# Patient Record
Sex: Female | Born: 1995 | Race: Black or African American | Hispanic: No | Marital: Single | Smoking: Never smoker
Health system: Southern US, Community
[De-identification: ages and names within clinical notes are randomized; demographics above are authoritative.]

## PROBLEM LIST (undated history)

## (undated) ENCOUNTER — Inpatient Hospital Stay (HOSPITAL_COMMUNITY): Payer: Self-pay

## (undated) ENCOUNTER — Inpatient Hospital Stay: Payer: Self-pay

## (undated) DIAGNOSIS — G43909 Migraine, unspecified, not intractable, without status migrainosus: Secondary | ICD-10-CM

## (undated) DIAGNOSIS — K219 Gastro-esophageal reflux disease without esophagitis: Secondary | ICD-10-CM

## (undated) DIAGNOSIS — O99820 Streptococcus B carrier state complicating pregnancy: Secondary | ICD-10-CM

## (undated) DIAGNOSIS — O42919 Preterm premature rupture of membranes, unspecified as to length of time between rupture and onset of labor, unspecified trimester: Secondary | ICD-10-CM

## (undated) DIAGNOSIS — Z789 Other specified health status: Secondary | ICD-10-CM

## (undated) HISTORY — DX: Other specified health status: Z78.9

## (undated) HISTORY — PX: WISDOM TOOTH EXTRACTION: SHX21

## (undated) HISTORY — DX: Migraine, unspecified, not intractable, without status migrainosus: G43.909

## (undated) HISTORY — PX: NO PAST SURGERIES: SHX2092

---
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
## 2017-08-07 ENCOUNTER — Emergency Department
Admission: EM | Admit: 2017-08-07 | Discharge: 2017-08-07 | Disposition: A | Discharge status: Home / Self Care | Payer: Medicaid Other | Attending: Student in an Organized Health Care Education/Training Program | Admitting: Student in an Organized Health Care Education/Training Program

## 2017-08-07 ENCOUNTER — Encounter: Payer: Self-pay | Admitting: Emergency Medicine

## 2017-08-07 ENCOUNTER — Other Ambulatory Visit: Payer: Self-pay

## 2017-08-07 DIAGNOSIS — Z79899 Other long term (current) drug therapy: Secondary | ICD-10-CM | POA: Insufficient documentation

## 2017-08-07 DIAGNOSIS — Z7722 Contact with and (suspected) exposure to environmental tobacco smoke (acute) (chronic): Secondary | ICD-10-CM | POA: Diagnosis not present

## 2017-08-07 DIAGNOSIS — H5712 Ocular pain, left eye: Secondary | ICD-10-CM | POA: Diagnosis present

## 2017-08-07 DIAGNOSIS — Z202 Contact with and (suspected) exposure to infections with a predominantly sexual mode of transmission: Secondary | ICD-10-CM | POA: Diagnosis not present

## 2017-08-07 LAB — CHLAMYDIA/NGC RT PCR (ARMC ONLY)
Chlamydia Tr: NOT DETECTED
N gonorrhoeae: NOT DETECTED

## 2017-08-07 LAB — WET PREP, GENITAL
Clue Cells Wet Prep HPF POC: NONE SEEN
Sperm: NONE SEEN
Trich, Wet Prep: NONE SEEN
Yeast Wet Prep HPF POC: NONE SEEN

## 2017-08-07 MED ORDER — POLYMYXIN B-TRIMETHOPRIM 10000-0.1 UNIT/ML-% OP SOLN: 1.0000 [drp] | OPHTHALMIC | 0 refills | Status: AC

## 2017-08-07 MED ORDER — CEFTRIAXONE SODIUM 250 MG IJ SOLR
250.0000 mg | Freq: Once | INTRAMUSCULAR | Status: AC
  Administered 2017-08-07: 250 mg via INTRAMUSCULAR
  Filled 2017-08-07: qty 250

## 2017-08-07 MED ORDER — METRONIDAZOLE 500 MG PO TABS
2000.0000 mg | ORAL_TABLET | Freq: Once | ORAL | Status: AC
  Administered 2017-08-07: 2000 mg via ORAL
  Filled 2017-08-07: qty 4

## 2017-08-07 MED ORDER — AZITHROMYCIN 500 MG PO TABS
1000.0000 mg | ORAL_TABLET | Freq: Once | ORAL | Status: AC
  Administered 2017-08-07: 1000 mg via ORAL
  Filled 2017-08-07: qty 2

## 2017-08-07 NOTE — ED Notes (Signed)
"  My eyes have been bothering me - [facility] - now I think I gave it to my baby."

## 2017-08-07 NOTE — ED Triage Notes (Signed)
Left eye irritation x 3 days, cough x 2 days.  Also wants to have an STD check.

## 2017-08-07 NOTE — ED Provider Notes (Signed)
[facility] Emergency Department Provider Note  ____________________________________________  Time seen: Approximately 3:06 PM  I have reviewed the triage vital signs and the nursing notes.   HISTORY  Chief Complaint No chief complaint on file.    HPI Renee Barnett is a 22 y.o. female presents to the emergency department with left eye conjunctivitis, nonproductive cough and concerns for STDs.  Patient reports that she has had left eye irritation and nonproductive cough for the past 2 days.  Patient is concerned that she might have a yeast infection.  She has had increased, white vaginal discharge and vaginal pruritus.  Patient denies dysuria, increased urinary frequency, bilateral flank pain or dyspareunia.  Patient is accompanied by both her boyfriend and her daughter who is also being seen for eye irritation.  Patient is currently having her menses.   Past Medical History:  Diagnosis Date  . GERD (gastroesophageal reflux disease)   . Medical history non-contributory     Patient Active Problem List   Diagnosis Date Noted  . Preterm premature rupture of membranes (PPROM) with unknown onset of labor 10/29/2016  . GBS (group B Streptococcus carrier), +RV culture, currently pregnant 10/27/2016    Past Surgical History:  Procedure Laterality Date  . NO PAST SURGERIES      Prior to Admission medications   Medication Sig Start Date End Date Taking? Authorizing Provider  ibuprofen (ADVIL,MOTRIN) 600 MG tablet Take 1 tablet (600 mg total) by mouth every 6 (six) hours. Patient not taking: Reported on 11/29/2016 11/01/16   Montez Morita, CNM  metroNIDAZOLE (FLAGYL) 500 MG tablet Take 1 tablet (500 mg total) by mouth 2 (two) times daily. 12/01/16   Roe Coombs, CNM  Prenatal Vit-Fe Fumarate-FA (PRENATAL MULTIVITAMIN) TABS tablet Take 1 tablet by mouth daily at 12 noon.    [provider]  ranitidine (ZANTAC) 150 MG tablet Take 1 tablet (150 mg  total) by mouth 2 (two) times daily. Patient not taking: Reported on 11/29/2016 09/03/16   Brock Bad, MD  trimethoprim-polymyxin b Joaquim Lai) ophthalmic solution Place 1 drop into the left eye every 4 (four) hours for 7 days. 08/07/17 08/14/17  Orvil Feil, PA-C    Allergies Patient has no known allergies.  Family History  Problem Relation Age of Onset  . Hypertension Mother   . Graves' disease Maternal Aunt   . Diabetes Maternal Grandmother     Social History Social History   Tobacco Use  . Smoking status: Passive Smoke Exposure - Never Smoker  . Smokeless tobacco: Never Used  Substance Use Topics  . Alcohol use: No    Alcohol/week: 0.0 oz  . Drug use: No     Review of Systems  Constitutional: No fever/chills Eyes: Patient has left eye irritation.  ENT: No upper respiratory complaints. Cardiovascular: no chest pain. Respiratory: no cough. No SOB. Gastrointestinal: No abdominal pain.  No nausea, no vomiting.  No diarrhea.  No constipation. Genitourinary: Patient has changes in vaginal discharge. Musculoskeletal: Negative for musculoskeletal pain. Skin: Negative for rash, abrasions, lacerations, ecchymosis. Neurological: Negative for headaches, focal weakness or numbness.   ____________________________________________   PHYSICAL EXAM:  VITAL SIGNS: ED Triage Vitals  Enc Vitals Group     BP 08/07/17 1406 (!) 118/41     Pulse Rate 08/07/17 1406 77     Resp 08/07/17 1406 16     Temp 08/07/17 1406 98.9 F (37.2 C)     Temp Source 08/07/17 1406 Oral     SpO2  08/07/17 1406 100 %     Weight 08/07/17 1407 160 lb (72.6 kg)     Height 08/07/17 1407 5\' 4"  (1.626 m)     Head Circumference --      Peak Flow --      Pain Score 08/07/17 1409 0     Pain Loc --      Pain Edu? --      Excl. in GC? --      Constitutional: Patient is sitting comfortably. Eyes: Left bulbar conjunctivitis visualized.  No matting or crusting visualized.  PERRL. EOMI. Head:  Atraumatic. ENT:      Ears: TMs are injected bilaterally.      Nose: No congestion/rhinnorhea.      Mouth/Throat: Mucous membranes are moist. Hematological/Lymphatic/Immunilogical: No cervical lymphadenopathy. Cardiovascular: Normal rate, regular rhythm. Normal S1 and S2.  Good peripheral circulation. Respiratory: Normal respiratory effort without tachypnea or retractions. Lungs CTAB. Good air entry to the bases with no decreased or absent breath sounds. Gastrointestinal: Bowel sounds 4 quadrants. Soft and nontender to palpation. No guarding or rigidity. No palpable masses. No distention. No CVA tenderness. Genitourinary: No cervical motion tenderness was elicited.  Copious blood visualized in the vaginal vault. Musculoskeletal: Full range of motion to all extremities. No gross deformities appreciated. Neurologic:  Normal speech and language. No gross focal neurologic deficits are appreciated.  Skin:  Skin is warm, dry and intact. No rash noted. Psychiatric: Mood and affect are normal. Speech and behavior are normal. Patient exhibits appropriate insight and judgement.   ____________________________________________   LABS (all labs ordered are listed, but only abnormal results are displayed)  Labs Reviewed  CHLAMYDIA/NGC RT PCR ([facility] ONLY)  WET PREP, GENITAL   ____________________________________________  EKG   ____________________________________________  RADIOLOGY   No results found.  ____________________________________________    PROCEDURES  Procedure(s) performed:    Procedures    Medications  cefTRIAXone (ROCEPHIN) injection 250 mg (250 mg Intramuscular Given 08/07/17 1550)  metroNIDAZOLE (FLAGYL) tablet 2,000 mg (2,000 mg Oral Given 08/07/17 1552)  azithromycin (ZITHROMAX) tablet 1,000 mg (1,000 mg Oral Given 08/07/17 1551)     ____________________________________________   INITIAL IMPRESSION / ASSESSMENT AND PLAN / ED COURSE  Pertinent labs &  imaging results that were available during my care of the patient were reviewed by me and considered in my medical decision making (see chart for details).  Review of the [place] CSRS was performed in accordance of the [facility] prior to dispensing any controlled drugs.    Assessment and Plan:  STD exposure Conjunctivitis Patient presents to the emergency department after STD exposure.  Patient was tested for gonorrhea, chlamydia and trichomoniasis in the emergency department.  Patient opted to be treated empirically for gonorrhea, chlamydia, trichomoniasis and yeast vaginitis.  Patient received Rocephin, azithromycin and Flagyl in the emergency department.  Patient was advised to seek care at local health department for full STD panel.  Patient voiced understanding.  Vital signs remained reassuring throughout emergency department course.  All patient questions were answered.   ____________________________________________  FINAL CLINICAL IMPRESSION(S) / ED DIAGNOSES  Final diagnoses:  STD exposure      NEW MEDICATIONS STARTED DURING THIS VISIT:  ED Discharge Orders        Ordered    trimethoprim-polymyxin b (POLYTRIM) ophthalmic solution  Every 4 hours     08/07/17 1538          This chart was dictated using voice recognition software/Dragon. Despite best efforts to proofread, errors can occur which  can change the meaning. Any change was purely unintentional.    Orvil FeilWoods, Margi Edmundson M, PA-C 08/07/17 1555    Willy Eddyobinson, Patrick, MD 08/07/17 [phone]

## 2017-12-30 DIAGNOSIS — B009 Herpesviral infection, unspecified: Secondary | ICD-10-CM

## 2017-12-30 HISTORY — DX: Herpesviral infection, unspecified: B00.9

## 2018-02-23 ENCOUNTER — Encounter: Payer: Self-pay | Admitting: Emergency Medicine

## 2018-02-23 ENCOUNTER — Emergency Department
Admission: EM | Admit: 2018-02-23 | Discharge: 2018-02-23 | Disposition: A | Discharge status: Home / Self Care | Payer: Medicaid Other | Attending: Emergency Medicine | Admitting: Emergency Medicine

## 2018-02-23 ENCOUNTER — Other Ambulatory Visit: Payer: Self-pay

## 2018-02-23 DIAGNOSIS — Z7722 Contact with and (suspected) exposure to environmental tobacco smoke (acute) (chronic): Secondary | ICD-10-CM | POA: Insufficient documentation

## 2018-02-23 DIAGNOSIS — M436 Torticollis: Secondary | ICD-10-CM | POA: Diagnosis not present

## 2018-02-23 DIAGNOSIS — Z79899 Other long term (current) drug therapy: Secondary | ICD-10-CM | POA: Insufficient documentation

## 2018-02-23 DIAGNOSIS — M542 Cervicalgia: Secondary | ICD-10-CM | POA: Diagnosis present

## 2018-02-23 DIAGNOSIS — M62838 Other muscle spasm: Secondary | ICD-10-CM | POA: Diagnosis not present

## 2018-02-23 MED ORDER — IBUPROFEN 600 MG PO TABS
600.0000 mg | ORAL_TABLET | Freq: Once | ORAL | Status: AC
  Administered 2018-02-23: 600 mg via ORAL
  Filled 2018-02-23: qty 1

## 2018-02-23 MED ORDER — IBUPROFEN 800 MG PO TABS: 800.0000 mg | ORAL_TABLET | Freq: Three times a day (TID) | ORAL | 0 refills | Status: DC | PRN

## 2018-02-23 MED ORDER — CYCLOBENZAPRINE HCL 10 MG PO TABS
10.0000 mg | ORAL_TABLET | Freq: Once | ORAL | Status: AC
  Administered 2018-02-23: 10 mg via ORAL
  Filled 2018-02-23: qty 1

## 2018-02-23 MED ORDER — CYCLOBENZAPRINE HCL 5 MG PO TABS: 5.0000 mg | ORAL_TABLET | Freq: Three times a day (TID) | ORAL | 0 refills | Status: DC | PRN

## 2018-02-23 NOTE — Discharge Instructions (Signed)
Your exam is consistent with a muscle strain and spasms. Take the prescription meds as directed. Apply ice or moist heat to the muscles. Considering using a firm rubber ball to perform muscle manipulation and massage. Follow-up with your provider for ongoing symptoms.

## 2018-02-23 NOTE — ED Provider Notes (Signed)
[facility] Emergency Department Provider Note ____________________________________________  Time seen: 1127  I have reviewed the triage vital signs and the nursing notes.  HISTORY  Chief Complaint  Neck Pain  HPI Renee Barnett is a 22 y.o. female presents to the ED with complaints of left neck and upper back pain.  Patient describes onset was this morning upon awakening.  She denies any preceding injury, accident, or trauma.  Also denies any headache, chest pain, shortness of breath.  She is also denying any distal paresthesias, grip changes, or arm weakness.  Patient has taken a 600 mg dose of ibuprofen without any benefit to her symptoms.  She localizes pain to the left side of the neck and into the upper trapezius region.  Relief is achieved by holding her head in a right lateral position.  Past Medical History:  Diagnosis Date  . GERD (gastroesophageal reflux disease)   . Medical history non-contributory     Patient Active Problem List   Diagnosis Date Noted  . Preterm premature rupture of membranes (PPROM) with unknown onset of labor 10/29/2016  . GBS (group B Streptococcus carrier), +RV culture, currently pregnant 10/27/2016    Past Surgical History:  Procedure Laterality Date  . NO PAST SURGERIES      Prior to Admission medications   Medication Sig Start Date End Date Taking? Authorizing Provider  cyclobenzaprine (FLEXERIL) 5 MG tablet Take 1 tablet (5 mg total) by mouth 3 (three) times daily as needed for muscle spasms. 02/23/18   Kailey Esquilin, Charlesetta Ivory, PA-C  ibuprofen (ADVIL,MOTRIN) 800 MG tablet Take 1 tablet (800 mg total) by mouth every 8 (eight) hours as needed. 02/23/18   Alajia Schmelzer, Charlesetta Ivory, PA-C  Prenatal Vit-Fe Fumarate-FA (PRENATAL MULTIVITAMIN) TABS tablet Take 1 tablet by mouth daily at 12 noon.    [provider]    Allergies Patient has no known allergies.  Family History  Problem Relation Age of Onset  .  Hypertension Mother   . Graves' disease Maternal Aunt   . Diabetes Maternal Grandmother     Social History Social History   Tobacco Use  . Smoking status: Passive Smoke Exposure - Never Smoker  . Smokeless tobacco: Never Used  Substance Use Topics  . Alcohol use: No    Alcohol/week: 0.0 oz  . Drug use: No    Review of Systems  Constitutional: Negative for fever. Eyes: Negative for visual changes. ENT: Negative for sore throat. Cardiovascular: Negative for chest pain. Respiratory: Negative for shortness of breath. Gastrointestinal: Negative for abdominal pain, vomiting and diarrhea. Genitourinary: Negative for dysuria. Musculoskeletal: Negative for back pain.  Reports left neck and upper back pain as above. Skin: Negative for rash. Neurological: Negative for headaches, focal weakness or numbness. ____________________________________________  PHYSICAL EXAM:  VITAL SIGNS: ED Triage Vitals  Enc Vitals Group     BP 02/23/18 1122 131/60     Pulse Rate 02/23/18 1122 67     Resp 02/23/18 1122 16     Temp 02/23/18 1122 98.2 F (36.8 C)     Temp Source 02/23/18 1122 Oral     SpO2 02/23/18 1122 100 %     Weight 02/23/18 1117 180 lb (81.6 kg)     Height 02/23/18 1117 5\' 4"  (1.626 m)     Head Circumference --      Peak Flow --      Pain Score 02/23/18 1116 6     Pain Loc --      Pain  Edu? --      Excl. in GC? --     Constitutional: Alert and oriented. Well appearing and in no distress. Head: Normocephalic and atraumatic. Eyes: Conjunctivae are normal. Normal extraocular movements Neck: Supple.  No midline tenderness or palpable deformity noted.  Patient without any spasm to palpation of the left SCM.  She does have some palpable spasm with palpation over the left lower trapezius muscle. Cardiovascular: Normal rate, regular rhythm. Normal distal pulses. Respiratory: Normal respiratory effort. No wheezes/rales/rhonchi. Musculoskeletal: Normal spinal alignment without  midline tenderness, spasm, deformity, or step-off.  Nontender with normal range of motion in the upper extremities.  Normal composite fist and grip strength bilaterally. Neurologic: Cranial nerves II through XII grossly intact.  Normal intrinsic and opposition testing.. Normal speech and language. No gross focal neurologic deficits are appreciated. Skin:  Skin is warm, dry and intact. No rash noted. Psychiatric: Mood and affect are normal. Patient exhibits appropriate insight and judgment. ____________________________________________  PROCEDURES  Procedures Flexeril 10 mg PO IBU 800 mg PO ____________________________________________  INITIAL IMPRESSION / ASSESSMENT AND PLAN / ED COURSE  Patient with ED evaluation of left neck pain and spasm with sudden onset upon awakening this morning.  Her exam is consistent with muscle spasm and acute torticollis.  Patient's exam is otherwise benign without any signs of acute neuromuscular deficit.  She will be discharged with prescriptions for Flexeril and ibuprofen to dose as directed.  A work note is provided for 2 days as requested.  Return precautions have been reviewed. ____________________________________________  FINAL CLINICAL IMPRESSION(S) / ED DIAGNOSES  Final diagnoses:  Trapezius muscle spasm  Torticollis, acute      Zhaire Locker, Charlesetta IvoryJenise V Bacon, PA-C 02/23/18 1204    Minna AntisPaduchowski, Kevin, MD 02/23/18 1416

## 2018-02-23 NOTE — ED Triage Notes (Signed)
C/O left neck and upper back pain.  Denies injury.  States woke up this morning with pain.  Has taken Ibuprofen, with no relief.

## 2018-06-24 ENCOUNTER — Encounter: Payer: Self-pay | Admitting: Emergency Medicine

## 2018-06-24 ENCOUNTER — Emergency Department
Admission: EM | Admit: 2018-06-24 | Discharge: 2018-06-24 | Disposition: A | Discharge status: Home / Self Care | Payer: Medicaid Other | Attending: Emergency Medicine | Admitting: Emergency Medicine

## 2018-06-24 ENCOUNTER — Other Ambulatory Visit: Payer: Self-pay

## 2018-06-24 DIAGNOSIS — J0100 Acute maxillary sinusitis, unspecified: Secondary | ICD-10-CM | POA: Insufficient documentation

## 2018-06-24 DIAGNOSIS — Z79899 Other long term (current) drug therapy: Secondary | ICD-10-CM | POA: Insufficient documentation

## 2018-06-24 DIAGNOSIS — Z7722 Contact with and (suspected) exposure to environmental tobacco smoke (acute) (chronic): Secondary | ICD-10-CM | POA: Diagnosis not present

## 2018-06-24 DIAGNOSIS — J3489 Other specified disorders of nose and nasal sinuses: Secondary | ICD-10-CM | POA: Diagnosis present

## 2018-06-24 DIAGNOSIS — R590 Localized enlarged lymph nodes: Secondary | ICD-10-CM | POA: Diagnosis not present

## 2018-06-24 MED ORDER — AMOXICILLIN-POT CLAVULANATE 875-125 MG PO TABS: 1.0000 | ORAL_TABLET | Freq: Two times a day (BID) | ORAL | 0 refills | Status: AC

## 2018-06-24 NOTE — ED Triage Notes (Signed)
Sinus congestion x 1 week, green sinus drainage.

## 2018-06-24 NOTE — Discharge Instructions (Addendum)
Return to ER for increasing pain, fevers, urgent changes in your health. Take antibiotic as prescribed.

## 2018-06-24 NOTE — ED Notes (Signed)
Pt  Reports  Facial  Pain around l  Eye  With   Congestion  cough as well   Symptoms x 1  Week

## 2018-06-24 NOTE — ED Provider Notes (Signed)
[facility] EMERGENCY DEPARTMENT Provider Note   CSN: [id number] Arrival date & time: 06/24/18  1651     History   Chief Complaint Chief Complaint  Patient presents with  . Facial Pain    HPI Renee Barnett is a 22 y.o. female presents to the emergency department for evaluation of 1 week of left-sided maxillary sinus pain pressure with congestion.  Patient's symptoms increasing over the last couple days.  She denies any fevers but pain is moderate.  No vision changes or pain with extraocular eye movement.  No redness or drainage.  No photophobia.  No shortness of breath, chest pain.  She is not taking any Tylenol or ibuprofen for pain.  She has taken 1 dose of over-the-counter sinus medication with little relief.  HPI  Past Medical History:  Diagnosis Date  . GERD (gastroesophageal reflux disease)   . Medical history non-contributory     Patient Active Problem List   Diagnosis Date Noted  . Preterm premature rupture of membranes (PPROM) with unknown onset of labor 10/29/2016  . GBS (group B Streptococcus carrier), +RV culture, currently pregnant 10/27/2016    Past Surgical History:  Procedure Laterality Date  . NO PAST SURGERIES       OB History    Gravida  2   Para  2   Term  2   Preterm  0   AB  0   Living  2     SAB  0   TAB  0   Ectopic  0   Multiple  0   Live Births  2            Home Medications    Prior to Admission medications   Medication Sig Start Date End Date Taking? Authorizing Provider  amoxicillin-clavulanate (AUGMENTIN) 875-125 MG tablet Take 1 tablet by mouth every 12 (twelve) hours for 10 days. 06/24/18 07/04/18  Evon SlackGaines, Aries Kasa C, PA-C  cyclobenzaprine (FLEXERIL) 5 MG tablet Take 1 tablet (5 mg total) by mouth 3 (three) times daily as needed for muscle spasms. 02/23/18   Menshew, Charlesetta IvoryJenise V Bacon, PA-C  ibuprofen (ADVIL,MOTRIN) 800 MG tablet Take 1 tablet (800 mg total) by mouth every 8 (eight) hours as  needed. 02/23/18   Menshew, Charlesetta IvoryJenise V Bacon, PA-C  Prenatal Vit-Fe Fumarate-FA (PRENATAL MULTIVITAMIN) TABS tablet Take 1 tablet by mouth daily at 12 noon.    [provider]    Family History Family History  Problem Relation Age of Onset  . Hypertension Mother   . Graves' disease Maternal Aunt   . Diabetes Maternal Grandmother     Social History Social History   Tobacco Use  . Smoking status: Passive Smoke Exposure - Never Smoker  . Smokeless tobacco: Never Used  Substance Use Topics  . Alcohol use: No    Alcohol/week: 0.0 standard drinks  . Drug use: No     Allergies   Patient has no known allergies.   Review of Systems Review of Systems  Constitutional: Negative for fever.  HENT: Positive for congestion, rhinorrhea, sinus pressure and sinus pain. Negative for ear discharge, sore throat, trouble swallowing and voice change.   Eyes: Negative for photophobia, discharge and redness.  Respiratory: Positive for cough. Negative for shortness of breath, wheezing and stridor.   Cardiovascular: Negative for chest pain.  Gastrointestinal: Negative for abdominal pain, diarrhea, nausea and vomiting.  Genitourinary: Negative for dysuria, flank pain and pelvic pain.  Musculoskeletal: Positive for myalgias. Negative for back pain.  Skin: Negative for rash.  Neurological: Negative for dizziness and headaches.     Physical Exam Updated Vital Signs BP 129/78   Pulse 81   Temp 98.1 F (36.7 C) (Oral)   Resp 18   Ht 5\' 4"  (1.626 m)   Wt 79.4 kg   LMP 06/10/2018   SpO2 100%   BMI 30.04 kg/m   Physical Exam  Constitutional: She is oriented to person, place, and time. She appears well-developed and well-nourished. No distress.  HENT:  Head: Normocephalic and atraumatic.  Right Ear: Hearing, tympanic membrane, external ear and ear canal normal.  Left Ear: Hearing, tympanic membrane, external ear and ear canal normal.  Nose: Rhinorrhea present.  Mouth/Throat: Mucous  membranes are normal. No trismus in the jaw. No uvula swelling. Posterior oropharyngeal erythema present. No oropharyngeal exudate, posterior oropharyngeal edema or tonsillar abscesses. No tonsillar exudate.  Positive left greater than right maxillary sinus tenderness to palpation.  There is no erythema or swelling noted.  Full extraocular eye movement with no discomfort.  No conjunctival erythema bilaterally.  Eyes: Pupils are equal, round, and reactive to light. Conjunctivae are normal. Right eye exhibits no discharge. Left eye exhibits no discharge.  Neck: Normal range of motion.  Cardiovascular: Normal rate and regular rhythm.  Pulmonary/Chest: Effort normal and breath sounds normal. No stridor. No respiratory distress. She has no wheezes. She has no rales.  Abdominal: Soft. She exhibits no distension. There is no tenderness.  Musculoskeletal: Normal range of motion. She exhibits no deformity.  Lymphadenopathy:    She has cervical adenopathy.  Neurological: She is alert and oriented to person, place, and time. She has normal reflexes.  Skin: Skin is warm and dry.  Psychiatric: She has a normal mood and affect. Her behavior is normal. Thought content normal.  Nursing note and vitals reviewed.    ED Treatments / Results  Labs (all labs ordered are listed, but only abnormal results are displayed) Labs Reviewed - No data to display  EKG None  Radiology No results found.  Procedures Procedures (including critical care time)  Medications Ordered in ED Medications - No data to display   Initial Impression / Assessment and Plan / ED Course  I have reviewed the triage vital signs and the nursing notes.  Pertinent labs & imaging results that were available during my care of the patient were reviewed by me and considered in my medical decision making (see chart for details).     22 year old female with known recurrent maxillary sinusitis.  She started on Augmentin.  She will  continue with over-the-counter decongestant medication.  She is encouraged to take ibuprofen as needed for pain.  We discussed Veneta Penton.  She understands signs symptoms return to the ED for.  Final Clinical Impressions(s) / ED Diagnoses   Final diagnoses:  Acute non-recurrent maxillary sinusitis    ED Discharge Orders         Ordered    amoxicillin-clavulanate (AUGMENTIN) 875-125 MG tablet  Every 12 hours     06/24/18 1738           Ronnette Juniper 06/24/18 1749    Jene Every, MD 06/24/18 1911

## 2018-10-10 LAB — HM HIV SCREENING LAB: HM HIV Screening: NEGATIVE

## 2019-01-20 ENCOUNTER — Other Ambulatory Visit: Payer: Self-pay

## 2019-01-20 ENCOUNTER — Emergency Department
Admission: EM | Admit: 2019-01-20 | Discharge: 2019-01-20 | Disposition: A | Discharge status: Home / Self Care | Payer: Medicaid Other | Attending: Emergency Medicine | Admitting: Emergency Medicine

## 2019-01-20 DIAGNOSIS — J029 Acute pharyngitis, unspecified: Secondary | ICD-10-CM | POA: Insufficient documentation

## 2019-01-20 DIAGNOSIS — Z7722 Contact with and (suspected) exposure to environmental tobacco smoke (acute) (chronic): Secondary | ICD-10-CM | POA: Insufficient documentation

## 2019-01-20 DIAGNOSIS — Z79899 Other long term (current) drug therapy: Secondary | ICD-10-CM | POA: Insufficient documentation

## 2019-01-20 LAB — GROUP A STREP BY PCR: Group A Strep by PCR: NOT DETECTED

## 2019-01-20 MED ORDER — LIDOCAINE VISCOUS HCL 2 % MT SOLN
15.0000 mL | Freq: Once | OROMUCOSAL | Status: AC
  Administered 2019-01-20: 15 mL via OROMUCOSAL
  Filled 2019-01-20: qty 15

## 2019-01-20 MED ORDER — FEXOFENADINE-PSEUDOEPHED ER 60-120 MG PO TB12: 1.0000 | ORAL_TABLET | Freq: Two times a day (BID) | ORAL | 0 refills | Status: DC

## 2019-01-20 MED ORDER — MAGIC MOUTHWASH W/LIDOCAINE: 5.0000 mL | Freq: Four times a day (QID) | ORAL | 0 refills | Status: DC

## 2019-01-20 MED ORDER — DIPHENHYDRAMINE HCL 12.5 MG/5ML PO ELIX
12.5000 mg | ORAL_SOLUTION | Freq: Once | ORAL | Status: AC
  Administered 2019-01-20: 12.5 mg via ORAL
  Filled 2019-01-20: qty 5

## 2019-01-20 MED ORDER — LIDOCAINE VISCOUS HCL 2 % MT SOLN: 5.0000 mL | Freq: Four times a day (QID) | OROMUCOSAL | 0 refills | Status: DC | PRN

## 2019-01-20 NOTE — ED Provider Notes (Signed)
[facility] Emergency Department Provider Note   ____________________________________________   First MD Initiated Contact with Patient 01/20/19 [phone]     (approximate)  I have reviewed the triage vital signs and the nursing notes.   HISTORY  Chief Complaint Sore Throat    HPI Renee Barnett is a 23 y.o. female patient presents with sore throat for 2 days.  Patient state can tolerate fluids with discomfort with solid foods.  Patient also complained of nasal congestion and postnasal drainage.  Patient denies nausea, vomiting, diarrhea.  Patient rates the pain discomfort a 5/10.  Patient described pain is "sore".  No palliative measure for complaint.         Past Medical History:  Diagnosis Date   GERD (gastroesophageal reflux disease)    Medical history non-contributory     Patient Active Problem List   Diagnosis Date Noted   Preterm premature rupture of membranes (PPROM) with unknown onset of labor 10/29/2016   GBS (group B Streptococcus carrier), +RV culture, currently pregnant 10/27/2016    Past Surgical History:  Procedure Laterality Date   NO PAST SURGERIES      Prior to Admission medications   Medication Sig Start Date End Date Taking? Authorizing Provider  cyclobenzaprine (FLEXERIL) 5 MG tablet Take 1 tablet (5 mg total) by mouth 3 (three) times daily as needed for muscle spasms. 02/23/18   Menshew, Charlesetta IvoryJenise V Bacon, PA-C  fexofenadine-pseudoephedrine (ALLEGRA-D) 60-120 MG 12 hr tablet Take 1 tablet by mouth 2 (two) times daily. 01/20/19   Joni ReiningSmith, Hetal Proano K, PA-C  ibuprofen (ADVIL,MOTRIN) 800 MG tablet Take 1 tablet (800 mg total) by mouth every 8 (eight) hours as needed. 02/23/18   Menshew, Charlesetta IvoryJenise V Bacon, PA-C  lidocaine (XYLOCAINE) 2 % solution Use as directed 5 mLs in the mouth or throat every 6 (six) hours as needed for mouth pain. 01/20/19   Joni ReiningSmith, Athaliah Baumbach K, PA-C  Prenatal Vit-Fe Fumarate-FA (PRENATAL MULTIVITAMIN) TABS tablet Take 1  tablet by mouth daily at 12 noon.    [provider]    Allergies Patient has no known allergies.  Family History  Problem Relation Age of Onset   Hypertension Mother    Luiz BlareGraves' disease Maternal Aunt    Diabetes Maternal Grandmother     Social History Social History   Tobacco Use   Smoking status: Passive Smoke Exposure - Never Smoker   Smokeless tobacco: Never Used  Substance Use Topics   Alcohol use: No    Alcohol/week: 0.0 standard drinks   Drug use: No    Review of Systems Constitutional: No fever/chills Eyes: No visual changes. ENT: Sore throat and nasal congestion.   Cardiovascular: Denies chest pain. Respiratory: Denies shortness of breath. Gastrointestinal: No abdominal pain.  No nausea, no vomiting.  No diarrhea.  No constipation. Genitourinary: Negative for dysuria. Musculoskeletal: Negative for back pain. Skin: Negative for rash. Neurological: Negative for headaches, focal weakness or numbness. ____________________________________________   PHYSICAL EXAM:  VITAL SIGNS: ED Triage Vitals  Enc Vitals Group     BP 01/20/19 0629 118/67     Pulse Rate 01/20/19 0629 60     Resp 01/20/19 0629 18     Temp 01/20/19 0629 99.1 F (37.3 C)     Temp Source 01/20/19 0629 Oral     SpO2 01/20/19 0629 99 %     Weight 01/20/19 0626 166 lb (75.3 kg)     Height 01/20/19 0626 5\' 4"  (1.626 m)     Head Circumference --  Peak Flow --      Pain Score 01/20/19 0626 5     Pain Loc --      Pain Edu? --      Excl. in [place]? --     Constitutional: Alert and oriented. Well appearing and in no acute distress. Eyes: Conjunctivae are normal. PERRL. EOMI. Head: Atraumatic. Nose: Edematous nasal turbinates.   Mouth/Throat: Mucous membranes are moist.  Oropharynx erythematous without exudate. Neck: No stridor.  Hematological/Lymphatic/Immunilogical: No cervical lymphadenopathy. Cardiovascular: Normal rate, regular rhythm. Grossly normal heart sounds.  Good  peripheral circulation. Respiratory: Normal respiratory effort.  No retractions. Lungs CTAB. Neurologic:  Normal speech and language. No gross focal neurologic deficits are appreciated. No gait instability. Skin:  Skin is warm, dry and intact. No rash noted. Psychiatric: Mood and affect are normal. Speech and behavior are normal.  ____________________________________________   LABS (all labs ordered are listed, but only abnormal results are displayed)  Labs Reviewed  GROUP A STREP BY PCR   ____________________________________________  EKG   ____________________________________________  RADIOLOGY  ED MD interpretation:    Official radiology report(s): No results found.  ____________________________________________   PROCEDURES  Procedure(s) performed (including Critical Care):  Procedures   ____________________________________________   INITIAL IMPRESSION / ASSESSMENT AND PLAN / ED COURSE  As part of my medical decision making, I reviewed the following data within the [place]         Patient presents with 2 days of sore throat.  Discussed negative rapid strep results with patient.  Patient will be treated for viral pharyngitis.  Patient advised to follow-up PCP as needed.      ____________________________________________   FINAL CLINICAL IMPRESSION(S) / ED DIAGNOSES  Final diagnoses:  Viral pharyngitis     ED Discharge Orders         Ordered    fexofenadine-pseudoephedrine (ALLEGRA-D) 60-120 MG 12 hr tablet  2 times daily     01/20/19 0755    magic mouthwash w/lidocaine SOLN  4 times daily,   Status:  Discontinued     01/20/19 0755    lidocaine (XYLOCAINE) 2 % solution  Every 6 hours PRN     01/20/19 0757           Note:  This document was prepared using Dragon voice recognition software and may include unintentional dictation errors.    Sable Feil, PA-C 01/20/19 0800    Nena Polio, MD 01/20/19 [phone]

## 2019-01-20 NOTE — ED Triage Notes (Signed)
Patient reports sore throat for 2 days worse tonight.

## 2019-02-18 ENCOUNTER — Other Ambulatory Visit: Payer: Self-pay

## 2019-02-18 ENCOUNTER — Emergency Department (HOSPITAL_COMMUNITY)
Admission: EM | Admit: 2019-02-18 | Discharge: 2019-02-18 | Disposition: A | Discharge status: Home / Self Care | Payer: Medicaid Other | Attending: Emergency Medicine | Admitting: Emergency Medicine

## 2019-02-18 ENCOUNTER — Encounter (HOSPITAL_COMMUNITY): Payer: Self-pay

## 2019-02-18 DIAGNOSIS — S0502XA Injury of conjunctiva and corneal abrasion without foreign body, left eye, initial encounter: Secondary | ICD-10-CM | POA: Insufficient documentation

## 2019-02-18 DIAGNOSIS — Y9389 Activity, other specified: Secondary | ICD-10-CM | POA: Insufficient documentation

## 2019-02-18 DIAGNOSIS — Y929 Unspecified place or not applicable: Secondary | ICD-10-CM | POA: Insufficient documentation

## 2019-02-18 DIAGNOSIS — Y999 Unspecified external cause status: Secondary | ICD-10-CM | POA: Insufficient documentation

## 2019-02-18 DIAGNOSIS — H1033 Unspecified acute conjunctivitis, bilateral: Secondary | ICD-10-CM

## 2019-02-18 DIAGNOSIS — S0501XA Injury of conjunctiva and corneal abrasion without foreign body, right eye, initial encounter: Secondary | ICD-10-CM | POA: Insufficient documentation

## 2019-02-18 DIAGNOSIS — Z79899 Other long term (current) drug therapy: Secondary | ICD-10-CM | POA: Insufficient documentation

## 2019-02-18 DIAGNOSIS — X58XXXA Exposure to other specified factors, initial encounter: Secondary | ICD-10-CM | POA: Insufficient documentation

## 2019-02-18 MED ORDER — FLUORESCEIN SODIUM 1 MG OP STRP
2.0000 | ORAL_STRIP | Freq: Once | OPHTHALMIC | Status: AC
  Administered 2019-02-18: 2 via OPHTHALMIC
  Filled 2019-02-18: qty 2

## 2019-02-18 MED ORDER — ERYTHROMYCIN 5 MG/GM OP OINT: TOPICAL_OINTMENT | OPHTHALMIC | 0 refills | Status: DC

## 2019-02-18 MED ORDER — TETRACAINE HCL 0.5 % OP SOLN
2.0000 [drp] | Freq: Once | OPHTHALMIC | Status: AC
  Administered 2019-02-18: 2 [drp] via OPHTHALMIC
  Filled 2019-02-18: qty 4

## 2019-02-18 NOTE — Discharge Instructions (Signed)
1. Medications: erythromycin, usual home medications 2. Treatment: rest, drink plenty of fluids, warm compresses 3. Follow Up: Please followup with Ophthalmology in 2 days if no improvement or worsening; Please return to the ER for vision changes, fevers or other concerns

## 2019-02-18 NOTE — ED Notes (Signed)
Woods Lamp at bedside.

## 2019-02-18 NOTE — ED Provider Notes (Signed)
[facility] EMERGENCY DEPARTMENT Provider Note   CSN: [id number] Arrival date & time: 02/18/19  2149    History   Chief Complaint Chief Complaint  Patient presents with  . Eye Drainage    HPI Renee Barnett is a 23 y.o. female with a hx of No major medical hx presents to the Emergency Department complaining of gradual, persistent, progressively worsening bilateral redness and irritation to her eyes.  Patient reports that yesterday she had her lash extensions placed and awoke this morning with drainage and matting to both eyes.  Patient reports this is not the first time she has had lash extensions but it is the first time she has awoken with drainage from her eyes.  She reports no pain or swelling at the site of the lash extensions.  No vision changes.  No fever, chills, headache, neck pain, lymphadenopathy.  Patient does not use contact lenses or glasses.  No aggravating or alleviating factors.  No treatments prior to arrival.     The history is provided by the patient and medical records. No language interpreter was used.    Past Medical History:  Diagnosis Date  . GERD (gastroesophageal reflux disease)   . Medical history non-contributory     Patient Active Problem List   Diagnosis Date Noted  . Preterm premature rupture of membranes (PPROM) with unknown onset of labor 10/29/2016  . GBS (group B Streptococcus carrier), +RV culture, currently pregnant 10/27/2016    Past Surgical History:  Procedure Laterality Date  . NO PAST SURGERIES       OB History    Gravida  2   Para  2   Term  2   Preterm  0   AB  0   Living  2     SAB  0   TAB  0   Ectopic  0   Multiple  0   Live Births  2            Home Medications    Prior to Admission medications   Medication Sig Start Date End Date Taking? Authorizing Provider  cyclobenzaprine (FLEXERIL) 5 MG tablet Take 1 tablet (5 mg total) by mouth 3 (three) times daily as needed for muscle  spasms. 02/23/18   Menshew, Charlesetta IvoryJenise V Bacon, PA-C  erythromycin ophthalmic ointment Place a 1/2 inch ribbon of ointment into the lower eyelid every 4 hours for 5 days 02/18/19   Baljit Liebert, Dahlia ClientHannah, PA-C  fexofenadine-pseudoephedrine (ALLEGRA-D) 60-120 MG 12 hr tablet Take 1 tablet by mouth 2 (two) times daily. 01/20/19   Joni ReiningSmith, Ronald K, PA-C  ibuprofen (ADVIL,MOTRIN) 800 MG tablet Take 1 tablet (800 mg total) by mouth every 8 (eight) hours as needed. 02/23/18   Menshew, Charlesetta IvoryJenise V Bacon, PA-C  lidocaine (XYLOCAINE) 2 % solution Use as directed 5 mLs in the mouth or throat every 6 (six) hours as needed for mouth pain. 01/20/19   Joni ReiningSmith, Ronald K, PA-C  Prenatal Vit-Fe Fumarate-FA (PRENATAL MULTIVITAMIN) TABS tablet Take 1 tablet by mouth daily at 12 noon.    [provider]    Family History Family History  Problem Relation Age of Onset  . Hypertension Mother   . Graves' disease Maternal Aunt   . Diabetes Maternal Grandmother     Social History Social History   Tobacco Use  . Smoking status: Passive Smoke Exposure - Never Smoker  . Smokeless tobacco: Never Used  Substance Use Topics  . Alcohol use: No    Alcohol/week:  0.0 standard drinks  . Drug use: No     Allergies   Patient has no known allergies.   Review of Systems Review of Systems  Constitutional: Negative for fever.  HENT: Negative for facial swelling, rhinorrhea and sinus pain.   Eyes: Positive for discharge, redness and itching. Negative for photophobia, pain and visual disturbance.  Respiratory: Negative for cough.   Cardiovascular: Negative for chest pain.  Gastrointestinal: Negative for abdominal pain, nausea and vomiting.  Musculoskeletal: Negative for neck pain and neck stiffness.  Skin: Negative for rash and wound.  Allergic/Immunologic: Negative for immunocompromised state.  Neurological: Negative for headaches.  Hematological: Negative for adenopathy.  Psychiatric/Behavioral: The patient is not  nervous/anxious.      Physical Exam Updated Vital Signs BP 128/84 (BP Location: Right Arm)   Pulse 70   Temp 98.4 F (36.9 C) (Oral)   Resp 18   Ht 5\' 4"  (1.626 m)   Wt 75.3 kg   SpO2 100%   BMI 28.49 kg/m   Physical Exam Vitals signs and nursing note reviewed.  Constitutional:      General: She is not in acute distress.    Appearance: She is well-developed. She is not diaphoretic.  HENT:     Head: Normocephalic and atraumatic.     Nose: Nose normal. No mucosal edema or rhinorrhea.     Mouth/Throat:     Pharynx: Uvula midline. No oropharyngeal exudate, posterior oropharyngeal erythema or uvula swelling.     Tonsils: No tonsillar abscesses.  Eyes:     General: Lids are normal. Lids are everted, no foreign bodies appreciated.        Right eye: No foreign body or discharge.        Left eye: No foreign body or discharge.     Conjunctiva/sclera:     Right eye: Right conjunctiva is injected. No chemosis, exudate or hemorrhage.    Left eye: Left conjunctiva is injected. No chemosis, exudate or hemorrhage.    Pupils: Pupils are equal, round, and reactive to light.     Right eye: Corneal abrasion and fluorescein uptake present.     Left eye: Corneal abrasion and fluorescein uptake present.     Visual Fields: Right eye visual fields normal and left eye visual fields normal.     Comments: Pupils equal round and reactive to light No vertical, horizontal or rotational nystagmus Very small linear corneal abrasions inferior to the iris. No visible foreign body No corneal flare, ulcer or dendritic staining  No herpetic lesions to the face or around the eye  Visual Acuity:  Bilateral Distance: 20/12.5 R Distance: 20/16 L Distance: 20/12.5  Neck:     Musculoskeletal: Normal range of motion.     Comments: Full range of motion without pain No midline or paraspinal tenderness No nuchal rigidity; no meningeal signs Cardiovascular:     Rate and Rhythm: Normal rate and regular rhythm.   Pulmonary:     Effort: Pulmonary effort is normal. No respiratory distress.  Musculoskeletal: Normal range of motion.     Comments: Moves all extremities without difficulty  Skin:    General: Skin is warm and dry.     Findings: No erythema.  Neurological:     Mental Status: She is alert.     Comments: Alert, speech clear and goal directed.  No ataxia.      ED Treatments / Results   Procedures Procedures (including critical care time)  Medications Ordered in ED Medications  fluorescein ophthalmic strip  2 strip (2 strips Both Eyes Given 02/18/19 2231)  tetracaine (PONTOCAINE) 0.5 % ophthalmic solution 2 drop (2 drops Both Eyes Given 02/18/19 2231)     Initial Impression / Assessment and Plan / ED Course  I have reviewed the triage vital signs and the nursing notes.  Pertinent labs & imaging results that were available during my care of the patient were reviewed by me and considered in my medical decision making (see chart for details).        Renee Barnett presents with symptoms consistent with conjunctivitis.  Small bilateral corneal abrasions; no ulceration.  No entrapment, consensual photophobia, or dendritic staining with fluorescein study.  Presentation non-concerning for iritis, corneal abrasions, or HSV.  No evidence of preseptal or orbital cellulitis.  Pt is not a contact lens wearer.  Patient will be given erythromycin ophthalmic.  Personal hygiene and frequent handwashing discussed.  Patient advised to followup with ophthalmologist for reevaluation in several days..  Patient verbalizes understanding and is agreeable with discharge.    Final Clinical Impressions(s) / ED Diagnoses   Final diagnoses:  Acute bacterial conjunctivitis of both eyes  Bilateral corneal abrasions, initial encounter    ED Discharge Orders         Ordered    erythromycin ophthalmic ointment     02/18/19 2233           Asianae Minkler, Boyd KerbsHannah, PA-C 02/18/19 2258    Azalia Bilisampos, Kevin,  MD 02/18/19 2314

## 2019-02-18 NOTE — ED Triage Notes (Signed)
Per patient she woke up this morning with redness in her eyes, as well as drainage. C/o of some irritation. Fears when she got her lashes done yesterday the equipment wasn't properly cleaned.

## 2019-02-19 DIAGNOSIS — B009 Herpesviral infection, unspecified: Secondary | ICD-10-CM

## 2019-02-21 ENCOUNTER — Ambulatory Visit: Payer: Self-pay

## 2019-02-21 ENCOUNTER — Other Ambulatory Visit: Payer: Self-pay

## 2019-02-21 DIAGNOSIS — Z113 Encounter for screening for infections with a predominantly sexual mode of transmission: Secondary | ICD-10-CM

## 2019-02-21 DIAGNOSIS — B379 Candidiasis, unspecified: Secondary | ICD-10-CM

## 2019-02-21 MED ORDER — FLUCONAZOLE 50 MG PO TABS: 150.0000 mg | ORAL_TABLET | Freq: Every day | ORAL | Status: AC

## 2019-02-21 NOTE — Progress Notes (Signed)
Here for STD screening. Accepts bloodwork today.  Hal Morales, RN

## 2019-02-21 NOTE — Progress Notes (Signed)
    STI clinic/screening visit  Subjective:  Renee Barnett is a 23 y.o. female being seen today for an STI screening visit. The patient reports they do have symptoms.  Patient has the following medical conditionshas GBS (group B Streptococcus carrier), +RV culture, currently pregnant; Preterm premature rupture of membranes (PPROM) with unknown onset of labor; and Herpes on their problem list.  Chief Complaint  Patient presents with  . SEXUALLY TRANSMITTED DISEASE    Patient reports  HPI clietn repoerts white, thick clumpy disch  And itching x 1 wk.   See flowsheet for further details and programmatic requirements.    The following portions of the patient's history were reviewed and updated as appropriate: allergies, current medications, past family history, past medical history, past social history, past surgical history and problem list. Problem list updated.  Objective:  There were no vitals filed for this visit.  Physical Exam HENT:     Mouth/Throat:     Pharynx: Oropharynx is clear. No oropharyngeal exudate or posterior oropharyngeal erythema.  Neck:     Musculoskeletal: Neck supple. No muscular tenderness.  Genitourinary:    General: Normal vulva.     Vagina: Vaginal discharge present.     Comments: Thick, white, clumps disch, ph <,4.5 Lymphadenopathy:     Cervical: No cervical adenopathy.  Skin:    General: Skin is warm.     Findings: No lesion or rash.  Neurological:     Mental Status: She is alert.    Assessment and Plan:  Renee Barnett is a 23 y.o. female presenting to the [facility] Department for STI screening  There are no diagnoses linked to this encounter. STD screen  Treat wet prep for yeast as per SO. Diflucan 150 mg take 1 tablet .  Co. Client if sympts don't resole contact clinic for further treatment or can use OTC yeast cream.  No follow-ups on file.  No future appointments.  Hassell Done, FNP

## 2019-02-22 ENCOUNTER — Encounter: Payer: Self-pay | Admitting: Family Medicine

## 2019-02-22 ENCOUNTER — Telehealth: Payer: Self-pay | Admitting: Family Medicine

## 2019-02-22 LAB — WET PREP FOR TRICH, YEAST, CLUE: Trichomonas Exam: NEGATIVE

## 2019-02-22 NOTE — Telephone Encounter (Signed)
Returned patient phone call. Patient states she was "looking in her MyChart and thought she saw where she was pregnant. Am I wrong?" RN reviewed with patient lab orders at yesterday's appt and that a PT was not one of them. RN also reviewed with patient last PT result in [facility] was 2017. Patient stated she thought she was wrong but wanted to clarify. Patient denies further questions. Line was disconnected on patient's end.  Hal Morales, RN

## 2019-02-22 NOTE — Telephone Encounter (Signed)
PATIENT CALLED ABOUT [facility] OUT THROUGH MYCHART THAT SHE IS PREGNANT AND WAS NOT TOLD ABOUT IT DURING HER VISIT, SO SHE IS WANTING TO CLARIFY.

## 2019-02-26 ENCOUNTER — Other Ambulatory Visit: Payer: Self-pay

## 2019-02-26 ENCOUNTER — Encounter (HOSPITAL_COMMUNITY): Payer: Self-pay | Admitting: Emergency Medicine

## 2019-02-26 ENCOUNTER — Emergency Department (HOSPITAL_COMMUNITY)
Admission: EM | Admit: 2019-02-26 | Discharge: 2019-02-26 | Disposition: A | Discharge status: Home / Self Care | Payer: Self-pay | Attending: Emergency Medicine | Admitting: Emergency Medicine

## 2019-02-26 ENCOUNTER — Emergency Department (HOSPITAL_COMMUNITY): Payer: Self-pay

## 2019-02-26 DIAGNOSIS — Y998 Other external cause status: Secondary | ICD-10-CM | POA: Insufficient documentation

## 2019-02-26 DIAGNOSIS — S93509A Unspecified sprain of unspecified toe(s), initial encounter: Secondary | ICD-10-CM

## 2019-02-26 DIAGNOSIS — W228XXA Striking against or struck by other objects, initial encounter: Secondary | ICD-10-CM | POA: Insufficient documentation

## 2019-02-26 DIAGNOSIS — Y929 Unspecified place or not applicable: Secondary | ICD-10-CM | POA: Insufficient documentation

## 2019-02-26 DIAGNOSIS — Z7722 Contact with and (suspected) exposure to environmental tobacco smoke (acute) (chronic): Secondary | ICD-10-CM | POA: Insufficient documentation

## 2019-02-26 DIAGNOSIS — Y9389 Activity, other specified: Secondary | ICD-10-CM | POA: Insufficient documentation

## 2019-02-26 DIAGNOSIS — S93504A Unspecified sprain of right lesser toe(s), initial encounter: Secondary | ICD-10-CM | POA: Insufficient documentation

## 2019-02-26 NOTE — ED Provider Notes (Signed)
[facility] Provider Note   CSN: [id number] Arrival date & time: 02/26/19  1117    History   Chief Complaint Chief Complaint  Patient presents with  . Foot Injury    HPI Renee Barnett is a 23 y.o. female.     HPI   23 year old female presents today with complaints of toe pain.  Patient notes she kicked a piece of glass yesterday causing bruising and pain to the right middle toe.  She notes pain with ambulation, no pain at rest.  She denies any open wounds or any other injuries.  No medications prior to arrival.  Past Medical History:  Diagnosis Date  . GBS (group B Streptococcus carrier), +RV culture, currently pregnant 10/27/2016  . GERD (gastroesophageal reflux disease)   . Medical history non-contributory   . Preterm premature rupture of membranes (PPROM) with unknown onset of labor 10/29/2016    Patient Active Problem List   Diagnosis Date Noted  . Herpes 12/30/2017    Past Surgical History:  Procedure Laterality Date  . NO PAST SURGERIES       OB History    Gravida  2   Para  2   Term  2   Preterm  0   AB  0   Living  2     SAB  0   TAB  0   Ectopic  0   Multiple  0   Live Births  2           Home Medications    Prior to Admission medications   Medication Sig Start Date End Date Taking? Authorizing Provider  cyclobenzaprine (FLEXERIL) 5 MG tablet Take 1 tablet (5 mg total) by mouth 3 (three) times daily as needed for muscle spasms. Patient not taking: Reported on 02/21/2019 02/23/18   Menshew, Dannielle Karvonen, PA-C  erythromycin ophthalmic ointment Place a 1/2 inch ribbon of ointment into the lower eyelid every 4 hours for 5 days Patient not taking: Reported on 02/21/2019 02/18/19   Muthersbaugh, Jarrett Soho, PA-C  fexofenadine-pseudoephedrine (ALLEGRA-D) 60-120 MG 12 hr tablet Take 1 tablet by mouth 2 (two) times daily. Patient not taking: Reported on 02/21/2019 01/20/19   Sable Feil, PA-C   ibuprofen (ADVIL,MOTRIN) 800 MG tablet Take 1 tablet (800 mg total) by mouth every 8 (eight) hours as needed. Patient not taking: Reported on 02/21/2019 02/23/18   Menshew, Dannielle Karvonen, PA-C  lidocaine (XYLOCAINE) 2 % solution Use as directed 5 mLs in the mouth or throat every 6 (six) hours as needed for mouth pain. Patient not taking: Reported on 02/21/2019 01/20/19   Sable Feil, PA-C    Family History Family History  Problem Relation Age of Onset  . Hypertension Mother   . Graves' disease Maternal Aunt   . Diabetes Maternal Grandmother     Social History Social History   Tobacco Use  . Smoking status: Passive Smoke Exposure - Never Smoker  . Smokeless tobacco: Never Used  Substance Use Topics  . Alcohol use: No    Alcohol/week: 0.0 standard drinks  . Drug use: No     Allergies   Patient has no known allergies.   Review of Systems Review of Systems  All other systems reviewed and are negative.   Physical Exam Updated Vital Signs BP 114/85 (BP Location: Right Arm)   Pulse 83   Temp 98.7 F (37.1 C) (Oral)   Resp 16   SpO2 100%   Physical Exam  Vitals signs and nursing note reviewed.  Constitutional:      Appearance: She is well-developed.  HENT:     Head: Normocephalic and atraumatic.  Eyes:     General: No scleral icterus.       Right eye: No discharge.        Left eye: No discharge.     Conjunctiva/sclera: Conjunctivae normal.     Pupils: Pupils are equal, round, and reactive to light.  Neck:     Musculoskeletal: Normal range of motion.     Vascular: No JVD.     Trachea: No tracheal deviation.  Pulmonary:     Effort: Pulmonary effort is normal.     Breath sounds: No stridor.  Musculoskeletal:     Comments: Bruising noted to the plantar aspect of the right third toe, range at the DIP painful, sensation intact, no open wounds, normal anatomical alignment  Neurological:     Mental Status: She is alert and oriented to person, place, and time.      Coordination: Coordination normal.  Psychiatric:        Behavior: Behavior normal.        Thought Content: Thought content normal.        Judgment: Judgment normal.      ED Treatments / Results  Labs (all labs ordered are listed, but only abnormal results are displayed) Labs Reviewed - No data to display  EKG None  Radiology Dg Toe 3rd Right  Result Date: 02/26/2019 CLINICAL DATA:  Right third toe pain after blunt trauma EXAM: RIGHT THIRD TOE COMPARISON:  None. FINDINGS: There is no evidence of fracture or dislocation. There is no evidence of arthropathy or other focal bone abnormality. Soft tissues are unremarkable. IMPRESSION: Negative. Electronically Signed   By: Duanne GuessNicholas  Plundo M.D.   On: 02/26/2019 11:57    Procedures Procedures (including critical care time)  Medications Ordered in ED Medications - No data to display   Initial Impression / Assessment and Plan / ED Course  I have reviewed the triage vital signs and the nursing notes.  Pertinent labs & imaging results that were available during my care of the patient were reviewed by me and considered in my medical decision making (see chart for details).        Assessment/Plan: 23 year old female presents today with contusion to her foot.  No bony abnormality.  Discharged symptomatic care and strict return precautions.  Verbalized understanding and agreement to today's plan.   Final Clinical Impressions(s) / ED Diagnoses   Final diagnoses:  Sprain of toe, initial encounter    ED Discharge Orders    None       Rosalio LoudHedges, Murial Beam, PA-C 02/26/19 1733    Gwyneth SproutPlunkett, Whitney, MD 02/26/19 2157

## 2019-02-26 NOTE — Discharge Instructions (Addendum)
Please read attached information. If you experience any new or worsening signs or symptoms please return to the emergency room for evaluation. Please follow-up with your primary care provider or specialist as discussed.  °

## 2019-02-26 NOTE — ED Triage Notes (Signed)
Rt middle toe pain after kicking a window yesterday

## 2019-03-02 ENCOUNTER — Telehealth: Payer: Self-pay | Admitting: Family Medicine

## 2019-03-02 NOTE — Telephone Encounter (Signed)
Returned phone call to patient. Patient complaining of vaginal odor and discharge. Requesting an appt to be "rechecked." STI appt scheduled for 03/07/2019 @ 11:00. Instructed patient to arrive at 10:40 for check in. Patient agreeable to same.  Hal Morales, RN

## 2019-03-02 NOTE — Telephone Encounter (Signed)
Patient is still having discharge and odor.

## 2019-03-05 ENCOUNTER — Ambulatory Visit: Payer: Self-pay

## 2019-03-05 ENCOUNTER — Encounter: Payer: Self-pay | Admitting: Family Medicine

## 2019-03-07 ENCOUNTER — Ambulatory Visit: Payer: Self-pay

## 2019-04-10 ENCOUNTER — Telehealth: Payer: Self-pay

## 2019-04-10 NOTE — Telephone Encounter (Signed)
TC with patient.  Explained that state lab didn't complete oropharyngeal test for GC and Chlamydia at last visit.  Patient would like to come in for testing.  Appt scheduled Aileen Fass, RN

## 2019-04-12 ENCOUNTER — Ambulatory Visit: Payer: Self-pay

## 2019-04-13 ENCOUNTER — Ambulatory Visit
Admission: EM | Admit: 2019-04-13 | Discharge: 2019-04-13 | Disposition: A | Discharge status: Home / Self Care | Payer: Medicaid Other | Attending: Emergency Medicine | Admitting: Emergency Medicine

## 2019-04-13 ENCOUNTER — Other Ambulatory Visit: Payer: Self-pay

## 2019-04-13 ENCOUNTER — Encounter: Payer: Self-pay | Admitting: Emergency Medicine

## 2019-04-13 DIAGNOSIS — H60503 Unspecified acute noninfective otitis externa, bilateral: Secondary | ICD-10-CM

## 2019-04-13 MED ORDER — NEOMYCIN-POLYMYXIN-HC 3.5-10000-1 OT SUSP: 4.0000 [drp] | Freq: Three times a day (TID) | OTIC | 0 refills | Status: DC

## 2019-04-13 NOTE — Discharge Instructions (Signed)
Use eardrops as prescribed. Return to clinic on Sunday for reevaluation, or sooner if you develop worsening pain, decreased hearing, fever, jaw pain.

## 2019-04-13 NOTE — ED Triage Notes (Signed)
PT present here at [facility] c/o left ear pain/throbbing started last night

## 2019-04-13 NOTE — ED Provider Notes (Addendum)
[facility]    CSN: [id number] Arrival date & time: 04/13/19  1857      History   Chief Complaint Chief Complaint  Patient presents with  . Ear Pain    HPI Renee Barnett is a 23 y.o. female presenting for left ear pain since last night.  Patient endorsing muffled hearing, throbbing sensation.  Denies radiation of pain, worse when tugging at her earlobe.  Denies fever, nasal congestion, sinus pressure, jaw pain, pain with chewing.  Denies recent travel, water exposure, frequent ear infections.   Past Medical History:  Diagnosis Date  . GBS (group B Streptococcus carrier), +RV culture, currently pregnant 10/27/2016  . GERD (gastroesophageal reflux disease)   . Medical history non-contributory   . Preterm premature rupture of membranes (PPROM) with unknown onset of labor 10/29/2016    Patient Active Problem List   Diagnosis Date Noted  . Herpes 12/30/2017    Past Surgical History:  Procedure Laterality Date  . NO PAST SURGERIES      OB History    Gravida  2   Para  2   Term  2   Preterm  0   AB  0   Living  2     SAB  0   TAB  0   Ectopic  0   Multiple  0   Live Births  2            Home Medications    Prior to Admission medications   Medication Sig Start Date End Date Taking? Authorizing Provider  neomycin-polymyxin-hydrocortisone (CORTISPORIN) 3.5-10000-1 OTIC suspension Place 4 drops into both ears 3 (three) times daily. 04/13/19   Hall-Potvin, [place], PA-C    Family History Family History  Problem Relation Age of Onset  . Hypertension Mother   . Graves' disease Maternal Aunt   . Diabetes Maternal Grandmother     Social History Social History   Tobacco Use  . Smoking status: Passive Smoke Exposure - Never Smoker  . Smokeless tobacco: Never Used  Substance Use Topics  . Alcohol use: No    Alcohol/week: 0.0 standard drinks  . Drug use: No     Allergies   Patient has no known allergies.   Review of Systems  Review of Systems  Constitutional: Negative for fatigue and fever.  HENT: Positive for ear pain. Negative for congestion, dental problem, ear discharge, facial swelling, hearing loss, sinus pain, sore throat, trouble swallowing and voice change.   Eyes: Negative for photophobia, pain and visual disturbance.  Respiratory: Negative for cough and shortness of breath.   Cardiovascular: Negative for chest pain and palpitations.  Gastrointestinal: Negative for diarrhea and vomiting.  Musculoskeletal: Negative for arthralgias and myalgias.  Neurological: Negative for dizziness and headaches.     Physical Exam Triage Vital Signs ED Triage Vitals  Enc Vitals Group     BP 04/13/19 1905 109/75     Pulse Rate 04/13/19 1904 73     Resp 04/13/19 1905 18     Temp 04/13/19 1904 98.5 F (36.9 C)     Temp Source 04/13/19 1904 Oral     SpO2 04/13/19 1905 97 %     Weight --      Height --      Head Circumference --      Peak Flow --      Pain Score 04/13/19 1905 5     Pain Loc --      Pain Edu? --  Excl. in GC? --    No data found.  Updated Vital Signs BP 109/75   Pulse 71   Temp 98.5 F (36.9 C)   Resp 18   SpO2 97%   Visual Acuity Right Eye Distance:   Left Eye Distance:   Bilateral Distance:    Right Eye Near:   Left Eye Near:    Bilateral Near:     Physical Exam Constitutional:      General: She is not in acute distress.    Appearance: She is normal weight. She is not ill-appearing.  HENT:     Head: Normocephalic and atraumatic.     Jaw: There is normal jaw occlusion. No tenderness or pain on movement.     Right Ear: Hearing and external ear normal. No tenderness. No mastoid tenderness.     Left Ear: Hearing and external ear normal. No tenderness. No mastoid tenderness.     Ears:     Comments: Left ear with negative tragal tenderness, mild discomfort when pulling on earlobe.  Bilateral EACs occluded with waxy cerumen.  Unable to visualize TMs, no sanguinous  discharge. S/P LAVAGE: TM intact on right without suppurativa fluid.  TM on left unable to be visualized second to cerumen impaction.  EACs mildly edematous, moderate injection bilaterally with waxy discharge.    Nose: No nasal deformity, septal deviation or nasal tenderness.     Right Turbinates: Not swollen or pale.     Left Turbinates: Not swollen or pale.     Right Sinus: No maxillary sinus tenderness or frontal sinus tenderness.     Left Sinus: No maxillary sinus tenderness or frontal sinus tenderness.     Mouth/Throat:     Lips: Pink. No lesions.     Mouth: Mucous membranes are moist. No injury.     Pharynx: Oropharynx is clear. Uvula midline. No posterior oropharyngeal erythema or uvula swelling.     Comments: no tonsillar exudate or hypertrophy, negative mastoid process tenderness Eyes:     General: No scleral icterus.    Pupils: Pupils are equal, round, and reactive to light.  Neck:     Musculoskeletal: Normal range of motion and neck supple. No muscular tenderness.  Cardiovascular:     Rate and Rhythm: Normal rate.  Pulmonary:     Effort: Pulmonary effort is normal.  Lymphadenopathy:     Cervical: No cervical adenopathy.  Skin:    Coloration: Skin is not jaundiced or pale.  Neurological:     Mental Status: She is alert and oriented to person, place, and time.      UC Treatments / Results  Labs (all labs ordered are listed, but only abnormal results are displayed) Labs Reviewed - No data to display  EKG   Radiology No results found.  Procedures Procedures (including critical care time)  Medications Ordered in UC Medications - No data to display  Initial Impression / Assessment and Plan / UC Course  I have reviewed the triage vital signs and the nursing notes.  Pertinent labs & imaging results that were available during my care of the patient were reviewed by me and considered in my medical decision making (see chart for details).     1.  Acute otitis  externa of both ears Bilateral ear lavage performed in office which patient tolerated some difficulty.  History and physical concerning for AOE: We will initiate Cortisporin today, have patient return to clinic in 2 days for reevaluation: Would consider oral antibiotic treatment for acute  otitis media versus malignant otitis externa if still having pain status post antibiotic use. Final Clinical Impressions(s) / UC Diagnoses   Final diagnoses:  Acute otitis externa of both ears, unspecified type     Discharge Instructions     Use eardrops as prescribed. Return to clinic on Sunday for reevaluation, or sooner if you develop worsening pain, decreased hearing, fever, jaw pain.    ED Prescriptions    Medication Sig Dispense Auth. Provider   neomycin-polymyxin-hydrocortisone (CORTISPORIN) 3.5-10000-1 OTIC suspension Place 4 drops into both ears 3 (three) times daily. 10 mL Hall-Potvin, [place], PA-C     Controlled Substance Prescriptions [place] Controlled Substance Registry consulted? Not Applicable   Shea EvansHall-Potvin, Brittany, PA-C 04/13/19 1940    Hall-Potvin, [place], [place] JerseyPA-C 04/13/19 1941

## 2019-04-27 ENCOUNTER — Ambulatory Visit: Payer: Self-pay | Admitting: Family Medicine

## 2019-04-27 ENCOUNTER — Other Ambulatory Visit: Payer: Self-pay

## 2019-04-27 DIAGNOSIS — N898 Other specified noninflammatory disorders of vagina: Secondary | ICD-10-CM | POA: Insufficient documentation

## 2019-04-27 DIAGNOSIS — N760 Acute vaginitis: Secondary | ICD-10-CM | POA: Insufficient documentation

## 2019-04-27 DIAGNOSIS — B9689 Other specified bacterial agents as the cause of diseases classified elsewhere: Secondary | ICD-10-CM

## 2019-04-27 DIAGNOSIS — Z113 Encounter for screening for infections with a predominantly sexual mode of transmission: Secondary | ICD-10-CM

## 2019-04-27 LAB — WET PREP FOR TRICH, YEAST, CLUE
Trichomonas Exam: NEGATIVE
Yeast Exam: NEGATIVE

## 2019-04-27 MED ORDER — METRONIDAZOLE 500 MG PO TABS: 500.0000 mg | ORAL_TABLET | Freq: Two times a day (BID) | ORAL | 0 refills | Status: AC

## 2019-04-27 NOTE — Progress Notes (Signed)
STI clinic/screening visit  Subjective:  Renee Barnett is a 23 y.o. female being seen today for an STI screening visit. The patient reports they do have symptoms.  Patient has the following medical conditions:   Patient Active Problem List   Diagnosis Date Noted  . BV (bacterial vaginosis) 04/27/2019  . Vaginal discharge 04/27/2019  . Herpes 12/30/2017     Chief Complaint  Patient presents with  . SEXUALLY TRANSMITTED DISEASE    HPI  Patient reports vaginal discharge and odor  See flowsheet for further details and programmatic requirements.    The following portions of the patient's history were reviewed and updated as appropriate: allergies, current medications, past medical history, past social history, past surgical history and problem list.  Objective:  There were no vitals filed for this visit.  Physical Exam Vitals signs and nursing note reviewed.  Constitutional:      Appearance: Normal appearance.  HENT:     Head: Normocephalic and atraumatic.     Mouth/Throat:     Mouth: Mucous membranes are moist.     Pharynx: Oropharynx is clear. No oropharyngeal exudate or posterior oropharyngeal erythema.  Pulmonary:     Effort: Pulmonary effort is normal.  Abdominal:     General: Abdomen is flat.     Palpations: There is no mass.     Tenderness: There is no abdominal tenderness. There is no rebound.  Genitourinary:    General: Normal vulva.     Exam position: Lithotomy position.     Pubic Area: No rash or pubic lice.      Labia:        Right: No rash or lesion.        Left: No rash or lesion.      Vagina: Normal. No vaginal discharge, erythema, bleeding or lesions.     Cervix: Discharge (homogenous white gray, ph>4.5) present. No cervical motion tenderness, friability, lesion or erythema.     Uterus: Normal.      Adnexa: Right adnexa normal and left adnexa normal.     Rectum: Normal.  Lymphadenopathy:     Head:     Right side of head: No preauricular or  posterior auricular adenopathy.     Left side of head: No preauricular or posterior auricular adenopathy.     Cervical: No cervical adenopathy.     Upper Body:     Right upper body: No supraclavicular or axillary adenopathy.     Left upper body: No supraclavicular or axillary adenopathy.     Lower Body: No right inguinal adenopathy. No left inguinal adenopathy.  Skin:    General: Skin is warm and dry.     Findings: No rash.  Neurological:     Mental Status: She is alert and oriented to person, place, and time.       Assessment and Plan:  Renee Barnett is a 23 y.o. female presenting to the [facility] Department for STI screening  1. Vaginal discharge - WET PREP FOR TRICH, YEAST, CLUE  2. Screening examination for venereal disease  Patient accepted all screenings including oral, vaginal CT/GC. She declines any bloodwork today and signed consent/refusal .  Treat wet prep per standing order Discussed that GC/CT NAAT may take >1 week to result. Patient will be called with positive results and encouraged patient to call if she had not heard in 2 weeks.  Counseled on warning s/sx and when to seek care Recommended condom use with all sex Patient is currently using *Nexplanon  to prevent pregnancy.  - WET PREP FOR [place], YEAST, [place] [facility]     Return in about 4 weeks (around 05/25/2019) for pap.  No future appointments.  Caren Macadam, MD

## 2019-04-27 NOTE — Progress Notes (Signed)
Here today for STD screening. Declines bloodwork. Hal Morales, RN Wet Prep results reviewed. Patient treated for BV per standing order. Hal Morales, RN

## 2019-05-02 ENCOUNTER — Other Ambulatory Visit: Payer: Self-pay

## 2019-05-02 MED ORDER — FLUCONAZOLE 150 MG PO TABS: 150.0000 mg | ORAL_TABLET | Freq: Once | ORAL | 0 refills | Status: AC

## 2019-05-02 NOTE — Telephone Encounter (Signed)
Patient would like to have a diflucion for yeast call into her pharmacy.

## 2019-05-10 ENCOUNTER — Telehealth: Payer: Self-pay

## 2019-05-11 NOTE — Telephone Encounter (Signed)
TC to patient. Verified ID via password/SS#. Informed of positive GC and need for tx. Instructed to eat before visit and have partner call for tx appt. Appt scheduled for 05/18/19 per patient request for that day. Aileen Fass, RN

## 2019-05-18 ENCOUNTER — Other Ambulatory Visit: Payer: Self-pay

## 2019-05-18 ENCOUNTER — Ambulatory Visit: Payer: Self-pay

## 2019-05-18 DIAGNOSIS — A549 Gonococcal infection, unspecified: Secondary | ICD-10-CM

## 2019-05-18 MED ORDER — CEFTRIAXONE SODIUM 250 MG IJ SOLR
250.0000 mg | Freq: Once | INTRAMUSCULAR | Status: AC
  Administered 2019-05-18: 250 mg via INTRAMUSCULAR

## 2019-05-18 MED ORDER — AZITHROMYCIN 500 MG PO TABS
1000.0000 mg | ORAL_TABLET | Freq: Once | ORAL | Status: AC
  Administered 2019-05-18: 17:00:00 1000 mg via ORAL

## 2019-05-18 NOTE — Progress Notes (Signed)
Tx'd for GC per SO. Tolerated well Munachimso Rigdon, RN  

## 2019-05-28 ENCOUNTER — Other Ambulatory Visit: Payer: Self-pay | Admitting: *Deleted

## 2019-05-28 MED ORDER — METRONIDAZOLE 0.75 % VA GEL: 1.0000 | Freq: Every day | VAGINAL | 1 refills | Status: DC

## 2019-06-01 ENCOUNTER — Ambulatory Visit
Admission: EM | Admit: 2019-06-01 | Discharge: 2019-06-01 | Disposition: A | Discharge status: Home / Self Care | Payer: Medicaid Other | Attending: Physician Assistant | Admitting: Physician Assistant

## 2019-06-01 ENCOUNTER — Other Ambulatory Visit: Payer: Self-pay

## 2019-06-01 DIAGNOSIS — Z20828 Contact with and (suspected) exposure to other viral communicable diseases: Secondary | ICD-10-CM

## 2019-06-01 DIAGNOSIS — R0981 Nasal congestion: Secondary | ICD-10-CM

## 2019-06-01 DIAGNOSIS — J3489 Other specified disorders of nose and nasal sinuses: Secondary | ICD-10-CM

## 2019-06-01 MED ORDER — CETIRIZINE HCL 10 MG PO TABS: 10.0000 mg | ORAL_TABLET | Freq: Every day | ORAL | 0 refills | Status: DC

## 2019-06-01 MED ORDER — FLUTICASONE PROPIONATE 50 MCG/ACT NA SUSP: 2.0000 | Freq: Every day | NASAL | 0 refills | Status: DC

## 2019-06-01 NOTE — ED Provider Notes (Signed)
[facility]    CSN: [id number] Arrival date & time: 06/01/19  1713      History   Chief Complaint Chief Complaint  Patient presents with  . Facial Swelling    HPI Renee Barnett is a 23 y.o. female.   23 year old female comes in for 3-day history of nasal congestion, sinus pressure.  She denies cough, sore throat.  Denies fever, chills, body aches.  Denies abdominal pain, nausea, vomiting, diarrhea.  Denies shortness of breath, loss of taste or smell.  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  She does not always wear her mask, and feels that the dust may be causing her symptoms.  States there are coworkers that are sick/out due to Covid, but unknown if she was exposed to them.  Has not tried anything for the symptoms.     Past Medical History:  Diagnosis Date  . GBS (group B Streptococcus carrier), +RV culture, currently pregnant 10/27/2016  . GERD (gastroesophageal reflux disease)   . Medical history non-contributory   . Preterm premature rupture of membranes (PPROM) with unknown onset of labor 10/29/2016    Patient Active Problem List   Diagnosis Date Noted  . BV (bacterial vaginosis) 04/27/2019  . Vaginal discharge 04/27/2019  . Herpes 12/30/2017    Past Surgical History:  Procedure Laterality Date  . NO PAST SURGERIES      OB History    Gravida  2   Para  2   Term  2   Preterm  0   AB  0   Living  2     SAB  0   TAB  0   Ectopic  0   Multiple  0   Live Births  2            Home Medications    Prior to Admission medications   Medication Sig Start Date End Date Taking? Authorizing Provider  cetirizine (ZYRTEC) 10 MG tablet Take 1 tablet (10 mg total) by mouth daily. 06/01/19   Cathie Hoops, Dennisha Mouser V, PA-C  fluticasone (FLONASE) 50 MCG/ACT nasal spray Place 2 sprays into both nostrils daily. 06/01/19   Belinda Fisher, PA-C    Family History Family History  Problem Relation Age of Onset  . Hypertension Mother   .  Graves' disease Maternal Aunt   . Diabetes Maternal Grandmother     Social History Social History   Tobacco Use  . Smoking status: Passive Smoke Exposure - Never Smoker  . Smokeless tobacco: Never Used  Substance Use Topics  . Alcohol use: No    Alcohol/week: 0.0 standard drinks  . Drug use: No     Allergies   Patient has no known allergies.   Review of Systems Review of Systems  Reason unable to perform ROS: See HPI as above.     Physical Exam Triage Vital Signs ED Triage Vitals  Enc Vitals Group     BP 06/01/19 1723 122/71     Pulse Rate 06/01/19 1723 86     Resp --      Temp 06/01/19 1723 99 F (37.2 C)     Temp Source 06/01/19 1723 Oral     SpO2 06/01/19 1723 99 %     Weight 06/01/19 1721 160 lb (72.6 kg)     Height --      Head Circumference --      Peak Flow --      Pain Score 06/01/19 1721 8  Pain Loc --      Pain Edu? --      Excl. in [place]? --    No data found.  Updated Vital Signs BP 122/71 (BP Location: Left Arm)   Pulse 86   Temp 99 F (37.2 C) (Oral)   Wt 160 lb (72.6 kg)   SpO2 99%   BMI 27.46 kg/m   Physical Exam Constitutional:      General: She is not in acute distress.    Appearance: Normal appearance. She is not ill-appearing, toxic-appearing or diaphoretic.  HENT:     Head: Normocephalic and atraumatic.     Nose:     Right Sinus: Maxillary sinus tenderness present. No frontal sinus tenderness.     Left Sinus: Maxillary sinus tenderness present. No frontal sinus tenderness.     Mouth/Throat:     Mouth: Mucous membranes are moist.     Pharynx: Oropharynx is clear. Uvula midline.  Neck:     Musculoskeletal: Normal range of motion and neck supple.  Cardiovascular:     Rate and Rhythm: Normal rate and regular rhythm.     Heart sounds: Normal heart sounds. No murmur. No friction rub. No gallop.   Pulmonary:     Effort: Pulmonary effort is normal. No accessory muscle usage, prolonged expiration, respiratory distress or  retractions.     Comments: Lungs clear to auscultation without adventitious lung sounds. Neurological:     General: No focal deficit present.     Mental Status: She is alert and oriented to person, place, and time.      UC Treatments / Results  Labs (all labs ordered are listed, but only abnormal results are displayed) Labs Reviewed  NOVEL CORONAVIRUS, NAA    EKG   Radiology No results found.  Procedures Procedures (including critical care time)  Medications Ordered in UC Medications - No data to display  Initial Impression / Assessment and Plan / UC Course  I have reviewed the triage vital signs and the nursing notes.  Pertinent labs & imaging results that were available during my care of the patient were reviewed by me and considered in my medical decision making (see chart for details).    No alarming signs on exam.  Patient speaking in full sentences without respiratory distress.  COVID testing ordered.  Patient to quarantine until testing results return.  Symptomatic treatment discussed.  Push fluids.  Return precautions given.  Patient expresses understanding and agrees to plan.  Final Clinical Impressions(s) / UC Diagnoses   Final diagnoses:  Nasal congestion  Sinus pressure    ED Prescriptions    Medication Sig Dispense Auth. Provider   fluticasone (FLONASE) 50 MCG/ACT nasal spray Place 2 sprays into both nostrils daily. 1 g Uziel Covault V, PA-C   cetirizine (ZYRTEC) 10 MG tablet Take 1 tablet (10 mg total) by mouth daily. 15 tablet Ok Edwards, PA-C     PDMP not reviewed this encounter.   Ok Edwards, PA-C 06/01/19 [phone]

## 2019-06-01 NOTE — Discharge Instructions (Signed)
As discussed, cannot rule out COVID. Currently, no alarming signs. Testing ordered. I would like you to quarantine until testing results. Start zyrtec and flonase to help with nasal congestion/drainage. If experiencing shortness of breath, trouble breathing, go to the emergency department for further evaluation needed.

## 2019-06-01 NOTE — ED Triage Notes (Signed)
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 Her face is sore and is in pain for 2 days, started Wed.

## 2019-06-02 ENCOUNTER — Telehealth: Payer: Self-pay | Admitting: Emergency Medicine

## 2019-06-02 NOTE — Telephone Encounter (Signed)
Attempted to call to follow up on patient.  Voicemail is full, unable to leave message.

## 2019-06-03 LAB — NOVEL CORONAVIRUS, NAA: SARS-CoV-2, NAA: NOT DETECTED

## 2019-06-06 ENCOUNTER — Ambulatory Visit: Payer: Medicaid Other

## 2019-06-07 ENCOUNTER — Ambulatory Visit: Payer: Self-pay | Admitting: Physician Assistant

## 2019-06-07 ENCOUNTER — Encounter: Payer: Self-pay | Admitting: Physician Assistant

## 2019-06-07 ENCOUNTER — Other Ambulatory Visit: Payer: Self-pay

## 2019-06-07 DIAGNOSIS — N760 Acute vaginitis: Secondary | ICD-10-CM

## 2019-06-07 DIAGNOSIS — Z113 Encounter for screening for infections with a predominantly sexual mode of transmission: Secondary | ICD-10-CM

## 2019-06-07 DIAGNOSIS — B9689 Other specified bacterial agents as the cause of diseases classified elsewhere: Secondary | ICD-10-CM

## 2019-06-07 LAB — WET PREP FOR TRICH, YEAST, CLUE
Trichomonas Exam: NEGATIVE
Yeast Exam: NEGATIVE

## 2019-06-07 MED ORDER — METRONIDAZOLE 500 MG PO TABS: 500.0000 mg | ORAL_TABLET | Freq: Two times a day (BID) | ORAL | 0 refills | Status: AC

## 2019-06-07 NOTE — Progress Notes (Addendum)
Here today for STD screening. Declines bloodwork. Hal Morales, RN [facility] results reviewed. Patient treated for BV per standing orders. Hal Morales, RN

## 2019-06-07 NOTE — Progress Notes (Signed)
STI clinic/screening visit  Subjective:  Renee Barnett is a 23 y.o. female being seen today for an STI screening visit. The patient reports they do have symptoms.  Patient has the following medical conditions:   Patient Active Problem List   Diagnosis Date Noted  . BV (bacterial vaginosis) 04/27/2019  . Vaginal discharge 04/27/2019  . Herpes 12/30/2017     Chief Complaint  Patient presents with  . SEXUALLY TRANSMITTED DISEASE    23 yo G2P2 here for eval of malodorous vag discharge this week. Using Nexplanon for [facility], considering switiching to another method at her annual exam in March due to irreg periods. Does not like the feel of condoms. One stable female sexual partner. Was treated for GC last month.   See flowsheet for further details and programmatic requirements.    The following portions of the patient's history were reviewed and updated as appropriate: allergies, current medications, past medical history, past social history, past surgical history and problem list.  Objective:  There were no vitals filed for this visit.  Physical Exam Exam conducted with a chaperone present.  Constitutional:      General: She is not in acute distress.    Appearance: Normal appearance. She is not toxic-appearing.  HENT:     Mouth/Throat:     Mouth: Mucous membranes are moist.     Pharynx: Oropharynx is clear. No oropharyngeal exudate or posterior oropharyngeal erythema.  Pulmonary:     Effort: Pulmonary effort is normal.  Abdominal:     General: Abdomen is flat. There is no distension.     Palpations: Abdomen is soft. There is no mass.     Tenderness: There is no abdominal tenderness. There is no guarding.  Genitourinary:    Pubic Area: No rash.      Labia:        Right: No rash, tenderness or lesion.        Left: No rash, tenderness or lesion.      Urethra: No urethral pain.     Vagina: No foreign body. Vaginal discharge present. No erythema, tenderness or lesions.   Cervix: No cervical motion tenderness, discharge, friability, lesion, erythema, cervical bleeding or eversion.     Uterus: Not enlarged and not tender.      Adnexa:        Right: No mass, tenderness or fullness.         Left: No mass, tenderness or fullness.       Rectum: No external hemorrhoid.     Comments: Shaven mons pubis.  Musculoskeletal: Normal range of motion.  Lymphadenopathy:     Cervical: No cervical adenopathy.     Lower Body: No right inguinal adenopathy. No left inguinal adenopathy.  Skin:    General: Skin is warm and dry.     Findings: No bruising, erythema, lesion or rash.  Neurological:     Mental Status: She is alert and oriented to person, place, and time.  Psychiatric:        Mood and Affect: Mood normal.        Behavior: Behavior normal.        Thought Content: Thought content normal.        Judgment: Judgment normal.       Assessment and Plan:  Renee Barnett is a 23 y.o. female presenting to the [facility] Department for STI screening  1. Routine screening for STI (sexually transmitted infection) Sx and wet prep c/w bacterial vaginosis. Treat per  S.O. Recommend repeat GC/Chlam testing at annual exam. - [place], [facility], [facility]     Return if symptoms worsen or fail to improve, for anticipate well-woman exam with Pap 10/2019.  No future appointments.  Lora Havens, PA-C

## 2019-06-14 ENCOUNTER — Encounter: Payer: Self-pay | Admitting: Physician Assistant

## 2019-06-18 ENCOUNTER — Telehealth: Payer: Self-pay

## 2019-06-18 NOTE — Telephone Encounter (Signed)
Diflucan 150mg  #1 called into [facility] on [address] in [place] per C. Hampton VO.Aileen Fass, RN

## 2019-06-18 NOTE — Telephone Encounter (Signed)
Consulted by RN re:  Patient request.  OK for RN to call in Diflucan 150 mg #1 for patient to take po one time.  No refills.

## 2019-08-13 ENCOUNTER — Other Ambulatory Visit: Payer: Medicaid Other

## 2019-09-23 NOTE — Progress Notes (Signed)
Chart reviewed by Pharmacist  Suzanne Walker PharmD, Contract Pharmacist at [facility]  

## 2019-10-17 ENCOUNTER — Ambulatory Visit
Admission: EM | Admit: 2019-10-17 | Discharge: 2019-10-17 | Disposition: A | Discharge status: Home / Self Care | Payer: Self-pay | Attending: Emergency Medicine | Admitting: Emergency Medicine

## 2019-10-17 DIAGNOSIS — H1011 Acute atopic conjunctivitis, right eye: Secondary | ICD-10-CM

## 2019-10-17 MED ORDER — OLOPATADINE HCL 0.2 % OP SOLN: 1.0000 [drp] | Freq: Every day | OPHTHALMIC | 0 refills | Status: DC

## 2019-10-17 NOTE — Discharge Instructions (Signed)
Keep area clean and dry: Avoid wearing contact lenses, make-up for the next week. Apply hot compresses for 5 to 10 minutes 3-5 times daily. May take Tylenol, ibuprofen for pain. Please return for worsening pain, swelling, redness, change in vision, fever.

## 2019-10-17 NOTE — ED Triage Notes (Signed)
Pt c/o rt eye itchy and redness to bottom eye lid since yesterday.

## 2019-10-17 NOTE — ED Provider Notes (Signed)
[facility]    CSN: [id number] Arrival date & time: 10/17/19  0804      History   Chief Complaint Chief Complaint  Patient presents with  . Eye Pain    HPI Renee Barnett is a 24 y.o. female presenting for right lower eyelid itching and redness since yesterday.  Patient denies foreign body sensation, mucoid discharge, morning crust, eye pain, contact lens use.  Patient does have eyelash extensions on, though had those placed 3/4 and does not have lower lids done.  Patient states that she used an expired eye ointment without significant relief: Cannot remember name.    Past Medical History:  Diagnosis Date  . GBS (group B Streptococcus carrier), +RV culture, currently pregnant 10/27/2016  . GERD (gastroesophageal reflux disease)   . Medical history non-contributory   . Preterm premature rupture of membranes (PPROM) with unknown onset of labor 10/29/2016    Patient Active Problem List   Diagnosis Date Noted  . BV (bacterial vaginosis) 04/27/2019  . Vaginal discharge 04/27/2019  . Herpes 12/30/2017    Past Surgical History:  Procedure Laterality Date  . NO PAST SURGERIES      OB History    Gravida  2   Para  2   Term  2   Preterm  0   AB  0   Living  2     SAB  0   TAB  0   Ectopic  0   Multiple  0   Live Births  2            Home Medications    Prior to Admission medications   Medication Sig Start Date End Date Taking? Authorizing Provider  Olopatadine HCl 0.2 % SOLN Apply 1 drop to eye daily. 10/17/19   Hall-Potvin, [place], PA-C    Family History Family History  Problem Relation Age of Onset  . Hypertension Mother   . Graves' disease Maternal Aunt   . Diabetes Maternal Grandmother     Social History Social History   Tobacco Use  . Smoking status: Passive Smoke Exposure - Never Smoker  . Smokeless tobacco: Never Used  Substance Use Topics  . Alcohol use: No    Alcohol/week: 0.0 standard drinks  . Drug use: No     Allergies   Patient has no known allergies.   Review of Systems As per HPI   Physical Exam Triage Vital Signs ED Triage Vitals  Enc Vitals Group     BP 10/17/19 0813 125/71     Pulse Rate 10/17/19 0813 69     Resp 10/17/19 0813 16     Temp 10/17/19 0813 98.3 F (36.8 C)     Temp src --      SpO2 10/17/19 0813 98 %     Weight --      Height --      Head Circumference --      Peak Flow --      Pain Score 10/17/19 0814 0     Pain Loc --      Pain Edu? --      Excl. in [place]? --    No data found.  Updated Vital Signs BP 125/71 (BP Location: Left Arm)   Pulse 69   Temp 98.3 F (36.8 C)   Resp 16   SpO2 98%   Visual Acuity Right Eye Distance: 20/25 Left Eye Distance: 20/25 Bilateral Distance: 20/20  Right Eye Near:   Left Eye Near:  Bilateral Near:     Physical Exam Constitutional:      General: She is not in acute distress.    Appearance: Normal appearance. She is not ill-appearing.  Eyes:     General: Lids are normal. Lids are everted, no foreign bodies appreciated. Vision grossly intact. Gaze aligned appropriately. No visual field deficit or scleral icterus.       Right eye: No foreign body, discharge or hordeolum.        Left eye: No foreign body, discharge or hordeolum.     Extraocular Movements: Extraocular movements intact.     Right eye: No nystagmus.     Left eye: No nystagmus.     Conjunctiva/sclera:     Right eye: Right conjunctiva is not injected. No chemosis, exudate or hemorrhage.    Left eye: Left conjunctiva is not injected. No chemosis, exudate or hemorrhage.    Pupils: Pupils are equal, round, and reactive to light.  Cardiovascular:     Rate and Rhythm: Normal rate.  Pulmonary:     Effort: Pulmonary effort is normal.     Breath sounds: No wheezing.  Musculoskeletal:     Cervical back: Neck supple. No tenderness.  Lymphadenopathy:     Cervical: No cervical adenopathy.  Skin:    Findings: No bruising, erythema or rash.      UC  Treatments / Results  Labs (all labs ordered are listed, but only abnormal results are displayed) Labs Reviewed - No data to display  EKG   Radiology No results found.  Procedures Procedures (including critical care time)  Medications Ordered in UC Medications - No data to display  Initial Impression / Assessment and Plan / UC Course  I have reviewed the triage vital signs and the nursing notes.  Pertinent labs & imaging results that were available during my care of the patient were reviewed by me and considered in my medical decision making (see chart for details).     History concerning for allergic conjunctivitis: Exam benign.  No evidence of stye.  Will trial Pataday eyedrops, follow-up with PCP or eye doctor in 1 week for repeat evaluation.  Also reviewed supportive measures as outlined below.  Return precautions discussed, patient verbalized understanding and is agreeable to plan. Final Clinical Impressions(s) / UC Diagnoses   Final diagnoses:  Allergic conjunctivitis of right eye     Discharge Instructions     Keep area clean and dry: Avoid wearing contact lenses, make-up for the next week. Apply hot compresses for 5 to 10 minutes 3-5 times daily. May take Tylenol, ibuprofen for pain. Please return for worsening pain, swelling, redness, change in vision, fever.    ED Prescriptions    Medication Sig Dispense Auth. Provider   Olopatadine HCl 0.2 % SOLN Apply 1 drop to eye daily. 2.5 mL Hall-Potvin, [place], PA-C     PDMP not reviewed this encounter.   Neldon Mc [place], [place] 10/17/19 [phone]

## 2019-10-21 ENCOUNTER — Other Ambulatory Visit: Payer: Self-pay

## 2019-10-21 ENCOUNTER — Ambulatory Visit
Admission: EM | Admit: 2019-10-21 | Discharge: 2019-10-21 | Disposition: A | Discharge status: Home / Self Care | Payer: Medicaid Other | Attending: Physician Assistant | Admitting: Physician Assistant

## 2019-10-21 DIAGNOSIS — J039 Acute tonsillitis, unspecified: Secondary | ICD-10-CM

## 2019-10-21 LAB — POCT RAPID STREP A (OFFICE): Rapid Strep A Screen: NEGATIVE

## 2019-10-21 MED ORDER — AMOXICILLIN 500 MG PO CAPS: 500.0000 mg | ORAL_CAPSULE | Freq: Two times a day (BID) | ORAL | 0 refills | Status: DC

## 2019-10-21 NOTE — Discharge Instructions (Signed)
Rapid strep negative. However, given your exam, will cover you empirically for bacterial infection with amoxicillin. As discussed, symptoms can still be due to viral illness/ drainage down your throat. This usually takes 7-10 days to resolve. Quarantine for now. If your symptoms does not resolve by Tuesday (3/23) then you will need a COVID test. If symptoms resolve on antibiotic, can go back to work. You can call us for an updated work note. Monitor for any worsening of symptoms, swelling of the throat, trouble breathing, trouble swallowing, leaning forward to breath, drooling, go to the emergency department for further evaluation needed.

## 2019-10-21 NOTE — ED Triage Notes (Signed)
Patient presents with a sore throat that started last night, and worse today.  She denies cough, runny nose, congestion or fever.

## 2019-10-21 NOTE — ED Provider Notes (Signed)
[facility]    CSN: [id number] Arrival date & time: 10/21/19  1455      History   Chief Complaint Chief Complaint  Patient presents with  . Sore Throat    HPI Renee Barnett is a 24 y.o. female.   24 year old female comes in for <24 hours of sore throat. Denies rhinorrhea, nasal congestion, cough. Denies fever, chills, body aches. Denies abdominal pain, nausea, vomiting, diarrhea. Denies shortness of breath, loss of taste/smell. Denies sick/COVID contact.      Past Medical History:  Diagnosis Date  . GBS (group B Streptococcus carrier), +RV culture, currently pregnant 10/27/2016  . GERD (gastroesophageal reflux disease)   . Medical history non-contributory   . Preterm premature rupture of membranes (PPROM) with unknown onset of labor 10/29/2016    Patient Active Problem List   Diagnosis Date Noted  . BV (bacterial vaginosis) 04/27/2019  . Vaginal discharge 04/27/2019  . Herpes 12/30/2017    Past Surgical History:  Procedure Laterality Date  . NO PAST SURGERIES      OB History    Gravida  2   Para  2   Term  2   Preterm  0   AB  0   Living  2     SAB  0   TAB  0   Ectopic  0   Multiple  0   Live Births  2            Home Medications    Prior to Admission medications   Medication Sig Start Date End Date Taking? Authorizing Provider  amoxicillin (AMOXIL) 500 MG capsule Take 1 capsule (500 mg total) by mouth 2 (two) times daily. 10/21/19   Tasia Catchings, Kalin Amrhein V, PA-C  Olopatadine HCl 0.2 % SOLN Apply 1 drop to eye daily. 10/17/19   Hall-Potvin, [place], PA-C    Family History Family History  Problem Relation Age of Onset  . Hypertension Mother   . Graves' disease Maternal Aunt   . Diabetes Maternal Grandmother     Social History Social History   Tobacco Use  . Smoking status: Passive Smoke Exposure - Never Smoker  . Smokeless tobacco: Never Used  Substance Use Topics  . Alcohol use: No    Alcohol/week: 0.0 standard drinks    . Drug use: No     Allergies   Patient has no known allergies.   Review of Systems Review of Systems  Reason unable to perform ROS: See HPI as above.     Physical Exam Triage Vital Signs ED Triage Vitals  Enc Vitals Group     BP 10/21/19 1501 114/70     Pulse Rate 10/21/19 1501 78     Resp 10/21/19 1501 15     Temp 10/21/19 1501 98.6 F (37 C)     Temp Source 10/21/19 1501 Oral     SpO2 10/21/19 1501 99 %     Weight --      Height --      Head Circumference --      Peak Flow --      Pain Score 10/21/19 1503 3     Pain Loc --      Pain Edu? --      Excl. in [place]? --    No data found.  Updated Vital Signs BP 114/70 (BP Location: Left Arm)   Pulse 78   Temp 98.6 F (37 C) (Oral)   Resp 15   SpO2 99%  Visual Acuity Right Eye Distance:   Left Eye Distance:   Bilateral Distance:    Right Eye Near:   Left Eye Near:    Bilateral Near:     Physical Exam Constitutional:      General: She is not in acute distress.    Appearance: Normal appearance. She is not ill-appearing, toxic-appearing or diaphoretic.  HENT:     Head: Normocephalic and atraumatic.     Mouth/Throat:     Mouth: Mucous membranes are moist.     Pharynx: Oropharynx is clear. Uvula midline. Posterior oropharyngeal erythema present.     Tonsils: Tonsillar exudate present. 2+ on the right. 2+ on the left.  Cardiovascular:     Rate and Rhythm: Normal rate and regular rhythm.     Heart sounds: Normal heart sounds. No murmur. No friction rub. No gallop.   Pulmonary:     Effort: Pulmonary effort is normal. No accessory muscle usage, prolonged expiration, respiratory distress or retractions.     Comments: Lungs clear to auscultation without adventitious lung sounds. Musculoskeletal:     Cervical back: Normal range of motion and neck supple.  Neurological:     General: No focal deficit present.     Mental Status: She is alert and oriented to person, place, and time.      UC Treatments /  Results  Labs (all labs ordered are listed, but only abnormal results are displayed) Labs Reviewed  CULTURE, GROUP A STREP [facility])  POCT RAPID STREP A (OFFICE)    EKG   Radiology No results found.  Procedures Procedures (including critical care time)  Medications Ordered in UC Medications - No data to display  Initial Impression / Assessment and Plan / UC Course  I have reviewed the triage vital signs and the nursing notes.  Pertinent labs & imaging results that were available during my care of the patient were reviewed by me and considered in my medical decision making (see chart for details).    Rapid strep negative.  However, given history and exam, will cover for tonsillitis with amoxicillin. Discussed canoot rule out COVID causing symptoms. Will have patient quarantine for now. If symptoms resolve after 24 hours abx, able to leave quarantine. However, if symptoms does not resolve, or develop other symptoms, will need COVID testing. Return precautions given.  Patient expresses understanding and agrees to plan.  Final Clinical Impressions(s) / UC Diagnoses   Final diagnoses:  Acute tonsillitis, unspecified etiology   ED Prescriptions    Medication Sig Dispense Auth. Provider   amoxicillin (AMOXIL) 500 MG capsule Take 1 capsule (500 mg total) by mouth 2 (two) times daily. 20 capsule Belinda Fisher, PA-C     PDMP not reviewed this encounter.   Belinda Fisher, PA-C 10/21/19 1520

## 2019-10-22 ENCOUNTER — Ambulatory Visit: Payer: Medicaid Other | Attending: Internal Medicine

## 2019-10-22 ENCOUNTER — Other Ambulatory Visit: Payer: Medicaid Other

## 2019-10-22 DIAGNOSIS — Z20822 Contact with and (suspected) exposure to covid-19: Secondary | ICD-10-CM

## 2019-10-23 LAB — SARS-COV-2, NAA 2 DAY TAT

## 2019-10-23 LAB — NOVEL CORONAVIRUS, NAA: SARS-CoV-2, NAA: NOT DETECTED

## 2019-10-25 LAB — CULTURE, GROUP A STREP (THRC)

## 2019-11-08 ENCOUNTER — Other Ambulatory Visit: Payer: Self-pay

## 2019-11-08 ENCOUNTER — Ambulatory Visit: Payer: Medicaid Other | Admitting: Physician Assistant

## 2019-11-08 DIAGNOSIS — Z113 Encounter for screening for infections with a predominantly sexual mode of transmission: Secondary | ICD-10-CM

## 2019-11-08 LAB — WET PREP FOR TRICH, YEAST, CLUE
Trichomonas Exam: NEGATIVE
Yeast Exam: NEGATIVE

## 2019-11-09 ENCOUNTER — Encounter: Payer: Self-pay | Admitting: Physician Assistant

## 2019-11-09 NOTE — Progress Notes (Signed)
  [facility] Department STI clinic/screening visit  Subjective:  Glorya Bartley is a 24 y.o. female being seen today for an STI screening visit. The patient reports they do have symptoms.  Patient reports that they do not desire a pregnancy in the next year.   They reported they are not interested in discussing contraception today.  No LMP recorded. Patient has had an implant.   Patient has the following medical conditions:   Patient Active Problem List   Diagnosis Date Noted  . BV (bacterial vaginosis) 04/27/2019  . Vaginal discharge 04/27/2019  . Herpes 12/30/2017    Chief Complaint  Patient presents with  . SEXUALLY TRANSMITTED DISEASE    HPI  Patient reports that she has noticed a cloudy discharge with odor for a few days.  States that she recently had her Nexplanon removed and replaced and does not have a period with the Nexplanon.   See flowsheet for further details and programmatic requirements.    The following portions of the patient's history were reviewed and updated as appropriate: allergies, current medications, past medical history, past social history, past surgical history and problem list.  Objective:  There were no vitals filed for this visit.  Physical Exam Constitutional:      General: She is not in acute distress.    Appearance: Normal appearance. She is normal weight.  HENT:     Head: Normocephalic and atraumatic.     Comments: No nits, lice, or hair loss. No cervical, supraclavicular or axillary adenopathy.    Mouth/Throat:     Mouth: Mucous membranes are moist.     Pharynx: Oropharynx is clear. No oropharyngeal exudate or posterior oropharyngeal erythema.  Eyes:     Conjunctiva/sclera: Conjunctivae normal.  Pulmonary:     Effort: Pulmonary effort is normal.  Musculoskeletal:     Cervical back: Neck supple. No tenderness.  Skin:    General: Skin is warm and dry.     Findings: No bruising, erythema, lesion or rash.  Neurological:      Mental Status: She is alert and oriented to person, place, and time.  Psychiatric:        Mood and Affect: Mood normal.        Behavior: Behavior normal.        Thought Content: Thought content normal.        Judgment: Judgment normal.     Patient desires to self-collect vaginal swabs for testing.  Assessment and Plan:  Shaira Sova is a 24 y.o. female presenting to the [facility] Department for STI screening  1. Screening for STD (sexually transmitted disease) Patient into clinic with symptoms.  Declines blood work today. Rec condoms with all sex. Reviewed with patient that wet mount is all normal. Rec that patient follow up if symptoms persist as not able to stay today for pelvic by provider. Await test results.  Counseled that RN will call if needs to RTC for treatment once results are back.  - WET PREP FOR TRICH, YEAST, CLUE - Gonococcus culture - Chlamydia/Gonorrhea [facility]     No follow-ups on file.  No future appointments.  Matt Holmes, PA

## 2019-11-12 LAB — GONOCOCCUS CULTURE

## 2019-11-13 ENCOUNTER — Ambulatory Visit: Payer: Medicaid Other

## 2019-11-19 ENCOUNTER — Ambulatory Visit: Payer: Medicaid Other

## 2019-12-24 ENCOUNTER — Ambulatory Visit
Admission: EM | Admit: 2019-12-24 | Discharge: 2019-12-24 | Disposition: A | Discharge status: Home / Self Care | Payer: Medicaid Other | Attending: Physician Assistant | Admitting: Physician Assistant

## 2019-12-24 DIAGNOSIS — R059 Cough, unspecified: Secondary | ICD-10-CM

## 2019-12-24 DIAGNOSIS — R0981 Nasal congestion: Secondary | ICD-10-CM

## 2019-12-24 MED ORDER — IPRATROPIUM BROMIDE 0.06 % NA SOLN
2.0000 | Freq: Four times a day (QID) | NASAL | 0 refills | Status: DC
Start: 2019-12-24 — End: 2020-05-06

## 2019-12-24 MED ORDER — FLUTICASONE PROPIONATE 50 MCG/ACT NA SUSP
2.0000 | Freq: Every day | NASAL | 0 refills | Status: DC
Start: 2019-12-24 — End: 2020-04-07

## 2019-12-24 NOTE — ED Triage Notes (Signed)
Pt c/o nasal congestion, sore throat, and cough x 5 days. Denies having covid vaccine. States using allergy meds with no relief.

## 2019-12-24 NOTE — Discharge Instructions (Signed)
COVID PCR testing ordered. I would like you to quarantine until testing results. Start flonase, atrovent nasal spray for nasal congestion/drainage. You can use over the counter nasal saline rinse such as neti pot for nasal congestion. Keep hydrated, your urine should be clear to pale yellow in color. Tylenol/motrin for fever and pain. Monitor for any worsening of symptoms, chest pain, shortness of breath, wheezing, swelling of the throat, go to the emergency department for further evaluation needed.   

## 2019-12-24 NOTE — ED Provider Notes (Signed)
[facility]    CSN: [id number] Arrival date & time: 12/24/19  1439      History   Chief Complaint Chief Complaint  Patient presents with  . Nasal Congestion    HPI Renee Barnett is a 24 y.o. female.   24 year old female comes in for 5 day of URI symptoms. Nasal congestion, rhinorrhea, sore throat, cough. Denies fever, chills, body aches. Denies abdominal pain, nausea, vomiting, diarrhea. Denies shortness of breath, loss of taste/smell. No COVID vaccine. otc anthistamine without relief.      Past Medical History:  Diagnosis Date  . GBS (group B Streptococcus carrier), +RV culture, currently pregnant 10/27/2016  . GERD (gastroesophageal reflux disease)   . Medical history non-contributory   . Preterm premature rupture of membranes (PPROM) with unknown onset of labor 10/29/2016    Patient Active Problem List   Diagnosis Date Noted  . BV (bacterial vaginosis) 04/27/2019  . Vaginal discharge 04/27/2019  . Herpes 12/30/2017    Past Surgical History:  Procedure Laterality Date  . NO PAST SURGERIES      OB History    Gravida  2   Para  2   Term  2   Preterm  0   AB  0   Living  2     SAB  0   TAB  0   Ectopic  0   Multiple  0   Live Births  2            Home Medications    Prior to Admission medications   Medication Sig Start Date End Date Taking? Authorizing Provider  fluticasone (FLONASE) 50 MCG/ACT nasal spray Place 2 sprays into both nostrils daily. 12/24/19   Cathie Hoops, Amar Sippel V, PA-C  ipratropium (ATROVENT) 0.06 % nasal spray Place 2 sprays into both nostrils 4 (four) times daily. 12/24/19   Belinda Fisher, PA-C    Family History Family History  Problem Relation Age of Onset  . Hypertension Mother   . Graves' disease Maternal Aunt   . Diabetes Maternal Grandmother     Social History Social History   Tobacco Use  . Smoking status: Passive Smoke Exposure - Never Smoker  . Smokeless tobacco: Never Used  Substance Use Topics  .  Alcohol use: No    Alcohol/week: 0.0 standard drinks  . Drug use: No     Allergies   Patient has no known allergies.   Review of Systems Review of Systems  Reason unable to perform ROS: See HPI as above.     Physical Exam Triage Vital Signs ED Triage Vitals  Enc Vitals Group     BP 12/24/19 1453 125/77     Pulse Rate 12/24/19 1453 85     Resp 12/24/19 1453 16     Temp 12/24/19 1453 99.3 F (37.4 C)     Temp Source 12/24/19 1453 Oral     SpO2 12/24/19 1453 98 %     Weight --      Height --      Head Circumference --      Peak Flow --      Pain Score 12/24/19 1500 0     Pain Loc --      Pain Edu? --      Excl. in GC? --    No data found.  Updated Vital Signs BP 125/77 (BP Location: Right Arm)   Pulse 85   Temp 99.3 F (37.4 C) (Oral)   Resp 16  SpO2 98%   Physical Exam Constitutional:      General: She is not in acute distress.    Appearance: Normal appearance. She is well-developed. She is not ill-appearing, toxic-appearing or diaphoretic.  HENT:     Head: Normocephalic and atraumatic.     Ears:     Comments: Cerumen impaction bilaterally.   Post ear irrigation: bilateral TM clear, no erythema, bulging.     Nose:     Right Sinus: No maxillary sinus tenderness or frontal sinus tenderness.     Left Sinus: No maxillary sinus tenderness or frontal sinus tenderness.     Mouth/Throat:     Mouth: Mucous membranes are moist.     Pharynx: Oropharynx is clear. Uvula midline.  Eyes:     Conjunctiva/sclera: Conjunctivae normal.     Pupils: Pupils are equal, round, and reactive to light.  Cardiovascular:     Rate and Rhythm: Normal rate and regular rhythm.  Pulmonary:     Effort: Pulmonary effort is normal. No accessory muscle usage, prolonged expiration, respiratory distress or retractions.     Breath sounds: No decreased air movement or transmitted upper airway sounds. No decreased breath sounds.     Comments: LCTAB Musculoskeletal:     Cervical back:  Normal range of motion and neck supple.  Skin:    General: Skin is warm and dry.  Neurological:     Mental Status: She is alert and oriented to person, place, and time.      UC Treatments / Results  Labs (all labs ordered are listed, but only abnormal results are displayed) Labs Reviewed  NOVEL CORONAVIRUS, NAA    EKG   Radiology No results found.  Procedures Procedures (including critical care time)  Medications Ordered in UC Medications - No data to display  Initial Impression / Assessment and Plan / UC Course  I have reviewed the triage vital signs and the nursing notes.  Pertinent labs & imaging results that were available during my care of the patient were reviewed by me and considered in my medical decision making (see chart for details).    COVID PCR test ordered. Patient to quarantine until testing results return. No alarming signs on exam.  Patient speaking in full sentences without respiratory distress.  Symptomatic treatment discussed.  Push fluids.  Return precautions given.  Patient expresses understanding and agrees to plan.  Final Clinical Impressions(s) / UC Diagnoses   Final diagnoses:  Nasal congestion  Cough   ED Prescriptions    Medication Sig Dispense Auth. Provider   fluticasone (FLONASE) 50 MCG/ACT nasal spray Place 2 sprays into both nostrils daily. 1 g Dayvian Blixt V, PA-C   ipratropium (ATROVENT) 0.06 % nasal spray Place 2 sprays into both nostrils 4 (four) times daily. 15 mL Ok Edwards, PA-C     PDMP not reviewed this encounter.   Ok Edwards, PA-C 12/24/19 1626

## 2019-12-25 LAB — SARS-COV-2, NAA 2 DAY TAT

## 2019-12-25 LAB — NOVEL CORONAVIRUS, NAA: SARS-CoV-2, NAA: NOT DETECTED

## 2020-01-07 ENCOUNTER — Other Ambulatory Visit: Payer: Self-pay

## 2020-01-07 ENCOUNTER — Ambulatory Visit: Payer: Medicaid Other | Admitting: Physician Assistant

## 2020-01-07 DIAGNOSIS — N760 Acute vaginitis: Secondary | ICD-10-CM | POA: Diagnosis not present

## 2020-01-07 DIAGNOSIS — Z113 Encounter for screening for infections with a predominantly sexual mode of transmission: Secondary | ICD-10-CM

## 2020-01-07 MED ORDER — METRONIDAZOLE 0.75 % VA GEL: 1.0000 | Freq: Every day | VAGINAL | 0 refills | Status: AC

## 2020-01-08 ENCOUNTER — Encounter: Payer: Self-pay | Admitting: Physician Assistant

## 2020-01-08 LAB — WET PREP FOR TRICH, YEAST, CLUE
Trichomonas Exam: NEGATIVE
Yeast Exam: NEGATIVE

## 2020-01-08 NOTE — Progress Notes (Signed)
[facility] Department STI clinic/screening visit  Subjective:  Renee Barnett is a 24 y.o. female being seen today for an STI screening visit. The patient reports they do have symptoms.  Patient reports that they do not desire a pregnancy in the next year.   They reported they are not interested in discussing contraception today.  No LMP recorded. Patient has had an implant.   Patient has the following medical conditions:   Patient Active Problem List   Diagnosis Date Noted  . BV (bacterial vaginosis) 04/27/2019  . Vaginal discharge 04/27/2019  . Herpes 12/30/2017    Chief Complaint  Patient presents with  . SEXUALLY TRANSMITTED DISEASE    HPI  Patient reports that she has noticed a white discharge for about 5 days.  Also, reports that she has had suprapubic pressure and feeling like she does not fully empty her bladder for a few days.  Reports last pap earlier this year when she had her Nexplanon replaced.  Patient states that she does not have a period with the Nexplanon and that she had a HIV test last year.   See flowsheet for further details and programmatic requirements.    The following portions of the patient's history were reviewed and updated as appropriate: allergies, current medications, past medical history, past social history, past surgical history and problem list.  Objective:  There were no vitals filed for this visit.  Physical Exam Constitutional:      General: She is not in acute distress.    Appearance: Normal appearance.  HENT:     Head: Normocephalic and atraumatic.     Comments: No nits, lice, or hair loss. No cervical, supraclavicular or axillary adenopathy.    Mouth/Throat:     Mouth: Mucous membranes are moist.     Pharynx: Oropharynx is clear. No oropharyngeal exudate or posterior oropharyngeal erythema.  Eyes:     Conjunctiva/sclera: Conjunctivae normal.  Pulmonary:     Effort: Pulmonary effort is normal.  Abdominal:   Palpations: Abdomen is soft. There is no mass.     Tenderness: There is no abdominal tenderness. There is no guarding or rebound.  Genitourinary:    General: Normal vulva.     Rectum: Normal.     Comments: External genitalia/pubic area without nits, lice, edema, erythema, lesions and inguinal adenopathy. Vagina with normal mucosa, small amount of thin, white discharge, pH=>4.5. Cervix without visible lesions. Uterus firm, mobile, nt, no masses, no CMT, no adnexal tenderness or fullness. Musculoskeletal:     Cervical back: Neck supple. No tenderness.  Skin:    General: Skin is warm and dry.     Findings: No bruising, erythema, lesion or rash.  Neurological:     Mental Status: She is alert and oriented to person, place, and time.  Psychiatric:        Mood and Affect: Mood normal.        Behavior: Behavior normal.        Thought Content: Thought content normal.        Judgment: Judgment normal.      Assessment and Plan:  Renee Barnett is a 24 y.o. female presenting to the [facility] Department for STI screening  1. Screening for STD (sexually transmitted disease) Patient into clinic with symptoms. Declines blood work today. Rec condoms with all sex. Await test results.  Counseled that RN will call if needs to RTC for further treatment once results are back.  - WET PREP FOR [place], YEAST, CLUE -  Gonococcus culture - Chlamydia/Gonorrhea [facility]  2. BV (bacterial vaginosis) Will treat for BV with Vandazole gel 1 app qhs for 5-7 days. No sex for 7 days. Rec using OTC antifungal cream if has itching during or just after treatment with antibiotic gel. - metroNIDAZOLE (VANDAZOLE) 0.75 % vaginal gel; Place 1 Applicatorful vaginally at bedtime for 5 days.  Dispense: 70 g; Refill: 0     No follow-ups on file.  No future appointments.  Matt Holmes, PA

## 2020-01-12 LAB — GONOCOCCUS CULTURE

## 2020-01-13 NOTE — Progress Notes (Signed)
Chart reviewed by Pharmacist  Suzanne Walker PharmD, Contract Pharmacist at [facility]  

## 2020-02-18 ENCOUNTER — Ambulatory Visit: Payer: Medicaid Other

## 2020-02-19 ENCOUNTER — Other Ambulatory Visit: Payer: Self-pay

## 2020-02-19 ENCOUNTER — Ambulatory Visit: Payer: Medicaid Other | Admitting: Physician Assistant

## 2020-02-19 DIAGNOSIS — Z5321 Procedure and treatment not carried out due to patient leaving prior to being seen by health care provider: Secondary | ICD-10-CM

## 2020-02-19 NOTE — Progress Notes (Signed)
Patient informed providers are in a meeting until 1:30. Given option to wait or reschedule. Patient states she has somewhere else to go and is going to reschedule. Not seen today by provider.Burt Knack, RN

## 2020-02-19 NOTE — Progress Notes (Signed)
Patient opted to leave and reschedule her appointment.

## 2020-02-23 ENCOUNTER — Ambulatory Visit
Admission: EM | Admit: 2020-02-23 | Discharge: 2020-02-23 | Disposition: A | Discharge status: Home / Self Care | Payer: Medicaid Other | Attending: Physician Assistant | Admitting: Physician Assistant

## 2020-02-23 ENCOUNTER — Other Ambulatory Visit: Payer: Self-pay

## 2020-02-23 DIAGNOSIS — R197 Diarrhea, unspecified: Secondary | ICD-10-CM

## 2020-02-23 DIAGNOSIS — Z20822 Contact with and (suspected) exposure to covid-19: Secondary | ICD-10-CM

## 2020-02-23 DIAGNOSIS — R110 Nausea: Secondary | ICD-10-CM

## 2020-02-23 DIAGNOSIS — R0981 Nasal congestion: Secondary | ICD-10-CM

## 2020-02-23 MED ORDER — ONDANSETRON 4 MG PO TBDP
4.0000 mg | ORAL_TABLET | Freq: Three times a day (TID) | ORAL | 0 refills | Status: DC | PRN
Start: 2020-02-23 — End: 2020-05-06

## 2020-02-23 NOTE — ED Provider Notes (Signed)
[facility]    CSN: [id number] Arrival date & time: 02/23/20  0820      History   Chief Complaint Chief Complaint  Patient presents with  . Nasal Congestion  . Diarrhea  . Abdominal Pain    HPI Renee Barnett is a 24 y.o. female.   24 year old female comes in for 1 week history of URI symptoms. Nasal congestion, diarrhea, nausea. Denies cough, sore throat, rhinorrhea. Denies fever, chills, body aches. Denies abdominal pain, vomiting. Denies shortness of breath, loss of taste/smell. Never smoker. Had negative COVID test yesterday, however, boyfriend, who she lives with is positive for COVID.      Past Medical History:  Diagnosis Date  . GBS (group B Streptococcus carrier), +RV culture, currently pregnant 10/27/2016  . GERD (gastroesophageal reflux disease)   . Medical history non-contributory   . Preterm premature rupture of membranes (PPROM) with unknown onset of labor 10/29/2016    Patient Active Problem List   Diagnosis Date Noted  . BV (bacterial vaginosis) 04/27/2019  . Vaginal discharge 04/27/2019  . Herpes 12/30/2017    Past Surgical History:  Procedure Laterality Date  . NO PAST SURGERIES      OB History    Gravida  2   Para  2   Term  2   Preterm  0   AB  0   Living  2     SAB  0   TAB  0   Ectopic  0   Multiple  0   Live Births  2            Home Medications    Prior to Admission medications   Medication Sig Start Date End Date Taking? Authorizing Provider  fluticasone (FLONASE) 50 MCG/ACT nasal spray Place 2 sprays into both nostrils daily. 12/24/19   Cathie Hoops, Dorothia Passmore V, PA-C  ipratropium (ATROVENT) 0.06 % nasal spray Place 2 sprays into both nostrils 4 (four) times daily. 12/24/19   Cathie Hoops, Rahi Chandonnet V, PA-C  ondansetron (ZOFRAN ODT) 4 MG disintegrating tablet Take 1 tablet (4 mg total) by mouth every 8 (eight) hours as needed for nausea or vomiting. 02/23/20   Belinda Fisher, PA-C    Family History Family History  Problem Relation  Age of Onset  . Hypertension Mother   . Graves' disease Maternal Aunt   . Diabetes Maternal Grandmother     Social History Social History   Tobacco Use  . Smoking status: Passive Smoke Exposure - Never Smoker  . Smokeless tobacco: Never Used  Substance Use Topics  . Alcohol use: No    Alcohol/week: 0.0 standard drinks  . Drug use: No     Allergies   Patient has no known allergies.   Review of Systems Review of Systems  Reason unable to perform ROS: See HPI as above.     Physical Exam Triage Vital Signs ED Triage Vitals  Enc Vitals Group     BP 02/23/20 0831 124/75     Pulse Rate 02/23/20 0831 98     Resp 02/23/20 0831 16     Temp 02/23/20 0831 99.9 F (37.7 C)     Temp Source 02/23/20 0831 Oral     SpO2 02/23/20 0831 98 %     Weight --      Height --      Head Circumference --      Peak Flow --      Pain Score 02/23/20 0841 0  Pain Loc --      Pain Edu? --      Excl. in GC? --    No data found.  Updated Vital Signs BP 124/75 (BP Location: Left Arm)   Pulse 98   Temp 99.9 F (37.7 C) (Oral)   Resp 16   SpO2 98%   Physical Exam Constitutional:      General: She is not in acute distress.    Appearance: Normal appearance. She is not ill-appearing, toxic-appearing or diaphoretic.  HENT:     Head: Normocephalic and atraumatic.     Mouth/Throat:     Mouth: Mucous membranes are moist.     Pharynx: Oropharynx is clear. Uvula midline.  Cardiovascular:     Rate and Rhythm: Normal rate and regular rhythm.     Heart sounds: Normal heart sounds. No murmur heard.  No friction rub. No gallop.   Pulmonary:     Effort: Pulmonary effort is normal. No accessory muscle usage, prolonged expiration, respiratory distress or retractions.     Comments: Lungs clear to auscultation without adventitious lung sounds. Abdominal:     General: Bowel sounds are normal.     Palpations: Abdomen is soft.     Tenderness: There is no abdominal tenderness. There is no  guarding or rebound.  Musculoskeletal:     Cervical back: Normal range of motion and neck supple.  Neurological:     General: No focal deficit present.     Mental Status: She is alert and oriented to person, place, and time.      UC Treatments / Results  Labs (all labs ordered are listed, but only abnormal results are displayed) Labs Reviewed - No data to display  EKG   Radiology No results found.  Procedures Procedures (including critical care time)  Medications Ordered in UC Medications - No data to display  Initial Impression / Assessment and Plan / UC Course  I have reviewed the triage vital signs and the nursing notes.  Pertinent labs & imaging results that were available during my care of the patient were reviewed by me and considered in my medical decision making (see chart for details).    Given patient's exposure/symptoms, suspect COVID and will have patient follow quarantine instructions for COVID. No alarming signs on exam.  Patient speaking in full sentences without respiratory distress.  Symptomatic treatment discussed.  Push fluids.  Return precautions given.  Patient expresses understanding and agrees to plan.  Final Clinical Impressions(s) / UC Diagnoses   Final diagnoses:  Nausea without vomiting  Diarrhea, unspecified type  Nasal congestion  Close exposure to COVID-19 virus   ED Prescriptions    Medication Sig Dispense Auth. Provider   ondansetron (ZOFRAN ODT) 4 MG disintegrating tablet Take 1 tablet (4 mg total) by mouth every 8 (eight) hours as needed for nausea or vomiting. 20 tablet Belinda Fisher, PA-C     PDMP not reviewed this encounter.   Belinda Fisher, PA-C 02/23/20 0930

## 2020-02-23 NOTE — ED Triage Notes (Signed)
Patient states she is here for evaluation of nasal congestion for over one week. She notes having diarrhea and nausea that started yesterday.  She did test negative for COVID (resulted yesterday), but states her boyfriend's test is positive. No COVID vaccination.

## 2020-02-23 NOTE — Discharge Instructions (Signed)
As discussed, given your exposure and symptoms, I would like you to quarantine as if you are positive for COVID regardless of symptoms for the next 2 weeks. Zofran as needed for nausea/vomiting. You can take over the counter flonase/nasacort to help with nasal congestion/drainage. Tylenol/motrin for pain and fever. Keep hydrated, urine should be clear to pale yellow in color. If experiencing shortness of breath, trouble breathing, go to the emergency department for further evaluation needed.

## 2020-03-07 ENCOUNTER — Telehealth: Payer: Self-pay

## 2020-03-07 NOTE — Telephone Encounter (Signed)
Attempted to reach pt to inform her that work note was sent through [employer] by med provider. Family answered the phone and informed RN that pt was at work. No information or message left with family member other than "I will call back later".

## 2020-03-10 ENCOUNTER — Ambulatory Visit (LOCAL_COMMUNITY_HEALTH_CENTER): Payer: Medicaid Other

## 2020-03-10 ENCOUNTER — Ambulatory Visit: Payer: Medicaid Other | Admitting: Family Medicine

## 2020-03-10 ENCOUNTER — Encounter: Payer: Self-pay | Admitting: Family Medicine

## 2020-03-10 ENCOUNTER — Other Ambulatory Visit: Payer: Self-pay

## 2020-03-10 DIAGNOSIS — Z23 Encounter for immunization: Secondary | ICD-10-CM

## 2020-03-10 DIAGNOSIS — Z113 Encounter for screening for infections with a predominantly sexual mode of transmission: Secondary | ICD-10-CM

## 2020-03-10 MED ORDER — METRONIDAZOLE 500 MG PO TABS: 500.0000 mg | ORAL_TABLET | Freq: Two times a day (BID) | ORAL | 0 refills | Status: AC

## 2020-03-10 NOTE — Progress Notes (Signed)
Tdap adm. L. Delt; see FP flow Sharlette Dense, RN

## 2020-03-10 NOTE — Progress Notes (Signed)
[facility] Department STI clinic/screening visit  Subjective:  Renee Barnett is a 24 y.o. female being seen today for an STI screening visit. The patient reports they do have symptoms.  Patient reports that they do not desire a pregnancy in the next year.   They reported they are not interested in discussing contraception today.  No LMP recorded. Patient has had an implant.   Patient has the following medical conditions:   Patient Active Problem List   Diagnosis Date Noted  . BV (bacterial vaginosis) 04/27/2019  . Vaginal discharge 04/27/2019  . Herpes 12/30/2017    Chief Complaint  Patient presents with  . SEXUALLY TRANSMITTED DISEASE    HPI  Patient reports vaginal discharge and odor for 1 week  Last HIV test per patient/review of record was within this year  Needs pap, encouraged FP visit.   See flowsheet for further details and programmatic requirements.    The following portions of the patient's history were reviewed and updated as appropriate: allergies, current medications, past medical history, past social history, past surgical history and problem list.  Objective:  There were no vitals filed for this visit.  Physical Exam Vitals and nursing note reviewed.  Constitutional:      Appearance: Normal appearance.  HENT:     Head: Normocephalic and atraumatic.     Mouth/Throat:     Mouth: Mucous membranes are moist.     Pharynx: Oropharynx is clear. No oropharyngeal exudate or posterior oropharyngeal erythema.  Pulmonary:     Effort: Pulmonary effort is normal.  Abdominal:     General: Abdomen is flat.     Palpations: There is no mass.     Tenderness: There is no abdominal tenderness. There is no rebound.  Genitourinary:    General: Normal vulva.     Exam position: Lithotomy position.     Pubic Area: No rash or pubic lice.      Labia:        Right: No rash or lesion.        Left: No rash or lesion.      Vagina: Vaginal discharge (white  gray, homogenous. Mild odor. Ph>4.5) present. No erythema, bleeding or lesions.     Cervix: No cervical motion tenderness, discharge, friability, lesion or erythema.     Uterus: Normal.      Adnexa: Right adnexa normal and left adnexa normal.     Rectum: Normal.  Lymphadenopathy:     Head:     Right side of head: No preauricular or posterior auricular adenopathy.     Left side of head: No preauricular or posterior auricular adenopathy.     Cervical: No cervical adenopathy.     Upper Body:     Right upper body: No supraclavicular or axillary adenopathy.     Left upper body: No supraclavicular or axillary adenopathy.     Lower Body: No right inguinal adenopathy. No left inguinal adenopathy.  Skin:    General: Skin is warm and dry.     Findings: No rash.  Neurological:     Mental Status: She is alert and oriented to person, place, and time.      Assessment and Plan:  Renee Barnett is a 24 y.o. female presenting to the [facility] Department for STI screening  1. Screening examination for venereal disease Treat wet mount per standing Due to Td booster, RN to offer Does not meet HBV or HCV criteria Needs pap, encouraged FP visit Has Nexplanon for  contraception - WET PREP FOR [place], YEAST, CLUE - Chlamydia/Gonorrhea [facility]     Return if symptoms worsen or fail to improve, for pap.  No future appointments.  Federico Flake, MD

## 2020-03-10 NOTE — Progress Notes (Signed)
In desiring screening due to discharge with odor x ~1 wk; declines HIV/RPR testing Sharlette Dense, RN  +BV treated per standing order; agrees to Tdap today Sharlette Dense, RN

## 2020-03-11 LAB — WET PREP FOR TRICH, YEAST, CLUE
Trichomonas Exam: NEGATIVE
Yeast Exam: NEGATIVE

## 2020-03-14 ENCOUNTER — Encounter: Payer: Self-pay | Admitting: Family Medicine

## 2020-04-07 ENCOUNTER — Other Ambulatory Visit: Payer: Self-pay

## 2020-04-07 ENCOUNTER — Ambulatory Visit
Admission: EM | Admit: 2020-04-07 | Discharge: 2020-04-07 | Disposition: A | Discharge status: Home / Self Care | Payer: Medicaid Other | Attending: Physician Assistant | Admitting: Physician Assistant

## 2020-04-07 DIAGNOSIS — H6123 Impacted cerumen, bilateral: Secondary | ICD-10-CM

## 2020-04-07 DIAGNOSIS — H6501 Acute serous otitis media, right ear: Secondary | ICD-10-CM

## 2020-04-07 MED ORDER — CETIRIZINE HCL 10 MG PO TABS
10.0000 mg | ORAL_TABLET | Freq: Every day | ORAL | 0 refills | Status: DC
Start: 2020-04-07 — End: 2020-04-16

## 2020-04-07 MED ORDER — ACETAMINOPHEN 325 MG PO TABS
650.0000 mg | ORAL_TABLET | Freq: Four times a day (QID) | ORAL | 0 refills | Status: DC | PRN
Start: 2020-04-07 — End: 2021-12-29

## 2020-04-07 MED ORDER — AMOXICILLIN-POT CLAVULANATE 875-125 MG PO TABS
1.0000 | ORAL_TABLET | Freq: Two times a day (BID) | ORAL | 0 refills | Status: DC
Start: 2020-04-07 — End: 2020-04-16

## 2020-04-07 MED ORDER — FLUTICASONE PROPIONATE 50 MCG/ACT NA SUSP
1.0000 | Freq: Every day | NASAL | 2 refills | Status: DC
Start: 2020-04-07 — End: 2020-04-16

## 2020-04-07 NOTE — ED Provider Notes (Signed)
[facility]    CSN: [id number] Arrival date & time: 04/07/20  1302      History   Chief Complaint Chief Complaint  Patient presents with  . Otalgia    Right    HPI Renee Barnett is a 24 y.o. female.   Patient presents for 2-day history of right ear pain.  She reports throbbing pain and decreased hearing in the right ear.  She also reports decreased hearing and buildup in the left ear.  She reports she has had issues with wax in the ears over the years.  She denies dizziness or ringing in the ear.  Denies fever or chills.  No recent upper respiratory symptoms.  Tylenol helps the pain somewhat.     Past Medical History:  Diagnosis Date  . GBS (group B Streptococcus carrier), +RV culture, currently pregnant 10/27/2016  . GERD (gastroesophageal reflux disease)   . Medical history non-contributory   . Preterm premature rupture of membranes (PPROM) with unknown onset of labor 10/29/2016    Patient Active Problem List   Diagnosis Date Noted  . BV (bacterial vaginosis) 04/27/2019  . Vaginal discharge 04/27/2019  . Herpes 12/30/2017    Past Surgical History:  Procedure Laterality Date  . NO PAST SURGERIES      OB History    Gravida  2   Para  2   Term  2   Preterm  0   AB  0   Living  2     SAB  0   TAB  0   Ectopic  0   Multiple  0   Live Births  2            Home Medications    Prior to Admission medications   Medication Sig Start Date End Date Taking? Authorizing Provider  acetaminophen (TYLENOL) 325 MG tablet Take 2 tablets (650 mg total) by mouth every 6 (six) hours as needed. 04/07/20   Niquan Charnley, Veryl Speak, PA-C  amoxicillin-clavulanate (AUGMENTIN) 875-125 MG tablet Take 1 tablet by mouth every 12 (twelve) hours. 04/07/20   Mckale Haffey, Veryl Speak, PA-C  cetirizine (ZYRTEC ALLERGY) 10 MG tablet Take 1 tablet (10 mg total) by mouth daily. 04/07/20 05/07/20  Lezli Danek, Veryl Speak, PA-C  fluticasone (FLONASE) 50 MCG/ACT nasal spray Place 1 spray into both  nostrils daily. 04/07/20   Shoshannah Faubert, Veryl Speak, PA-C  ipratropium (ATROVENT) 0.06 % nasal spray Place 2 sprays into both nostrils 4 (four) times daily. Patient not taking: Reported on 03/10/2020 12/24/19   Belinda Fisher, PA-C  ondansetron (ZOFRAN ODT) 4 MG disintegrating tablet Take 1 tablet (4 mg total) by mouth every 8 (eight) hours as needed for nausea or vomiting. Patient not taking: Reported on 03/10/2020 02/23/20   Lurline Idol    Family History Family History  Problem Relation Age of Onset  . Hypertension Mother   . Graves' disease Maternal Aunt   . Diabetes Maternal Grandmother     Social History Social History   Tobacco Use  . Smoking status: Never Smoker  . Smokeless tobacco: Never Used  Vaping Use  . Vaping Use: Never used  Substance Use Topics  . Alcohol use: No    Alcohol/week: 0.0 standard drinks  . Drug use: No     Allergies   Patient has no known allergies.   Review of Systems Review of Systems   Physical Exam Triage Vital Signs ED Triage Vitals  Enc Vitals Group     BP 04/07/20  1320 113/74     Pulse Rate 04/07/20 1320 72     Resp 04/07/20 1320 16     Temp 04/07/20 1320 98.9 F (37.2 C)     Temp Source 04/07/20 1320 Oral     SpO2 04/07/20 1320 100 %     Weight 04/07/20 1322 160 lb (72.6 kg)     Height 04/07/20 1322 5\' 4"  (1.626 m)     Head Circumference --      Peak Flow --      Pain Score 04/07/20 1322 5     Pain Loc --      Pain Edu? --      Excl. in GC? --    No data found.  Updated Vital Signs BP 113/74 (BP Location: Right Arm)   Pulse 72   Temp 98.9 F (37.2 C) (Oral)   Resp 16   Ht 5\' 4"  (1.626 m)   Wt 160 lb (72.6 kg)   SpO2 100%   BMI 27.46 kg/m   Visual Acuity Right Eye Distance:   Left Eye Distance:   Bilateral Distance:    Right Eye Near:   Left Eye Near:    Bilateral Near:     Physical Exam Vitals and nursing note reviewed.  Constitutional:      General: She is not in acute distress.    Appearance: She is  well-developed.  HENT:     Head: Normocephalic and atraumatic.     Ears:     Comments: Initial exam: No pain with manipulation of the tragus or auricle of either ear.  No external erythema or swelling.  Canals with cerumen impaction bilaterally.  Post irrigation: Canals clear.  Continues to have no pain with manipulation of the tragus.  Some improvement in pain in right ear.  Right TM with fluid posterior, no significant erythema, however not gray somewhat dusky appearance of TM.  Does not appear to be bulging, however no cone of light with clear landmarks.  Left TM pearly gray without injection.  Cone of light and landmarks appreciable.    Nose: No congestion or rhinorrhea.  Eyes:     Conjunctiva/sclera: Conjunctivae normal.  Cardiovascular:     Rate and Rhythm: Normal rate and regular rhythm.     Heart sounds: No murmur heard.   Pulmonary:     Effort: Pulmonary effort is normal. No respiratory distress.     Breath sounds: Normal breath sounds.  Abdominal:     Palpations: Abdomen is soft.     Tenderness: There is no abdominal tenderness.  Musculoskeletal:     Cervical back: Neck supple.  Skin:    General: Skin is warm and dry.  Neurological:     Mental Status: She is alert.      UC Treatments / Results  Labs (all labs ordered are listed, but only abnormal results are displayed) Labs Reviewed - No data to display  EKG   Radiology No results found.  Procedures Procedures (including critical care time)  Medications Ordered in UC Medications - No data to display  Initial Impression / Assessment and Plan / UC Course  I have reviewed the triage vital signs and the nursing notes.  Pertinent labs & imaging results that were available during my care of the patient were reviewed by me and considered in my medical decision making (see chart for details).     #Serous otitis media #Bilateral ear infection Patient is 24 year old presenting with otalgia and otitis media  of the  right ear.  Not definitively suppurative ear, will try symptomatic care prior to initiation of Augmentin.  Flonase and Zyrtec.  Instructed patient to start Augmentin if not improving over the next 24 to 48 hours.  She is agreeable to this plan of care.  She verbalized understanding plan of care.  Return, follow-up and emergency department precautions discussed.  Patient verbalized understanding Final Clinical Impressions(s) / UC Diagnoses   Final diagnoses:  Right acute serous otitis media, recurrence not specified  Bilateral impacted cerumen     Discharge Instructions     Start flonase and zyrtec today Continue tylenol  If not improving in 2 days, start the augmentin  If not improving with augmentin over a few days return or follow up with your PCP  Follow up with primary care as needed       ED Prescriptions    Medication Sig Dispense Auth. Provider   amoxicillin-clavulanate (AUGMENTIN) 875-125 MG tablet Take 1 tablet by mouth every 12 (twelve) hours. 14 tablet Ariatna Jester, Veryl Speak, PA-C   fluticasone (FLONASE) 50 MCG/ACT nasal spray Place 1 spray into both nostrils daily. 15.8 mL Carvell Hoeffner, Veryl Speak, PA-C   cetirizine (ZYRTEC ALLERGY) 10 MG tablet Take 1 tablet (10 mg total) by mouth daily. 30 tablet Shirlene Andaya, Veryl Speak, PA-C   acetaminophen (TYLENOL) 325 MG tablet Take 2 tablets (650 mg total) by mouth every 6 (six) hours as needed. 30 tablet Dali Kraner, Veryl Speak, PA-C     PDMP not reviewed this encounter.   Hermelinda Medicus, PA-C 04/07/20 1414

## 2020-04-07 NOTE — ED Triage Notes (Signed)
Patient in today w/ c/o Right ear pain x 2 days. Patient denies left ear pain. No other sx to report.

## 2020-04-07 NOTE — Discharge Instructions (Addendum)
Start flonase and zyrtec today Continue tylenol  If not improving in 2 days, start the augmentin  If not improving with augmentin over a few days return or follow up with your PCP  Follow up with primary care as needed

## 2020-04-10 ENCOUNTER — Other Ambulatory Visit: Payer: Self-pay

## 2020-04-10 ENCOUNTER — Ambulatory Visit
Admission: EM | Admit: 2020-04-10 | Discharge: 2020-04-10 | Disposition: A | Discharge status: Home / Self Care | Payer: Medicaid Other | Attending: Physician Assistant | Admitting: Physician Assistant

## 2020-04-10 DIAGNOSIS — Z20822 Contact with and (suspected) exposure to covid-19: Secondary | ICD-10-CM | POA: Insufficient documentation

## 2020-04-10 LAB — SARS CORONAVIRUS 2 (TAT 6-24 HRS): SARS Coronavirus 2: NEGATIVE

## 2020-04-10 NOTE — ED Triage Notes (Addendum)
Pt presents for covid testing post exposure. Pt is not having symptoms.

## 2020-04-10 NOTE — Discharge Instructions (Signed)

## 2020-04-15 ENCOUNTER — Telehealth: Payer: Self-pay

## 2020-04-15 NOTE — Telephone Encounter (Signed)
Phone call to pt regarding request for yeast medication. Per Arnetha Courser, CNM instruction, counseled pt that she could purchase over the counter yeast cream (without a prescription) or we could schedule an appt with our clinic for evaluation. Pt states she will just purchase the OTC yeast cream; appointment declined.

## 2020-04-16 ENCOUNTER — Other Ambulatory Visit: Payer: Self-pay

## 2020-04-16 ENCOUNTER — Ambulatory Visit: Payer: Medicaid Other | Admitting: Family Medicine

## 2020-04-16 ENCOUNTER — Encounter: Payer: Self-pay | Admitting: Family Medicine

## 2020-04-16 DIAGNOSIS — Z113 Encounter for screening for infections with a predominantly sexual mode of transmission: Secondary | ICD-10-CM

## 2020-04-16 LAB — WET PREP FOR TRICH, YEAST, CLUE
Trichomonas Exam: NEGATIVE
Yeast Exam: NEGATIVE

## 2020-04-16 NOTE — Progress Notes (Signed)
[facility] Department STI clinic/screening visit  Subjective:  Renee Barnett is a 24 y.o. female being seen today for an STI screening visit. The patient reports they do have symptoms.  Patient reports that they do not desire a pregnancy in the next year.   They reported they are not interested in discussing contraception today.  No LMP recorded. Patient has had an implant.   Patient has the following medical conditions:   Patient Active Problem List   Diagnosis Date Noted  . BV (bacterial vaginosis) 04/27/2019  . Vaginal discharge 04/27/2019  . Herpes 12/30/2017    No chief complaint on file.   HPI  Patient reports that she has had white discharge with odor for a few days.  States that she bathed with a different soap and believes that this was the cause of her symptoms.  She has a h/o BV  Last HIV test per patient/review of record was 2020 Patient reports last pap was 10/2019   See flowsheet for further details and programmatic requirements.    The following portions of the patient's history were reviewed and updated as appropriate: allergies, current medications, past medical history, past social history, past surgical history and problem list.  Objective:  There were no vitals filed for this visit.  Physical Exam Vitals and nursing note reviewed.  Constitutional:      Appearance: Normal appearance.  HENT:     Head: Normocephalic and atraumatic.     Mouth/Throat:     Mouth: Mucous membranes are moist.     Pharynx: Oropharynx is clear. No oropharyngeal exudate or posterior oropharyngeal erythema.  Pulmonary:     Effort: Pulmonary effort is normal.  Abdominal:     General: Abdomen is flat.     Palpations: There is no mass.     Tenderness: There is no abdominal tenderness. There is no rebound.  Genitourinary:    General: Normal vulva.     Exam position: Lithotomy position.     Pubic Area: No rash or pubic lice.      Labia:        Right: No rash or  lesion.        Left: No rash or lesion.      Vagina: Vaginal discharge present. No erythema, bleeding or lesions.     Cervix: No cervical motion tenderness, discharge, friability, lesion or erythema.     Uterus: Normal.      Adnexa: Right adnexa normal and left adnexa normal.     Rectum: Normal.     Comments: Small amt of white disharge,, pH=4.5 Bimanual not indicated Lymphadenopathy:     Head:     Right side of head: No preauricular or posterior auricular adenopathy.     Left side of head: No preauricular or posterior auricular adenopathy.     Cervical: No cervical adenopathy.     Upper Body:     Right upper body: No supraclavicular or axillary adenopathy.     Left upper body: No supraclavicular or axillary adenopathy.     Lower Body: No right inguinal adenopathy. No left inguinal adenopathy.  Skin:    General: Skin is warm and dry.     Findings: No rash.  Neurological:     Mental Status: She is alert and oriented to person, place, and time.    Assessment and Plan:  [name] is a 24 y.o. female presenting to the [facility] Department for STI screening  1. Screening examination for venereal disease  -  WET PREP FOR TRICH, YEAST, CLUE - Chlamydia/Gonorrhea [facility]     Return if symptoms worsen or fail to improve.  No future appointments.  Larene Pickett, FNP

## 2020-04-16 NOTE — Progress Notes (Signed)
Wet mount reviewed, no tx per standing order. Provider orders completed. 

## 2020-05-06 ENCOUNTER — Other Ambulatory Visit: Payer: Self-pay

## 2020-05-06 ENCOUNTER — Ambulatory Visit
Admission: EM | Admit: 2020-05-06 | Discharge: 2020-05-06 | Disposition: A | Discharge status: Home / Self Care | Payer: Medicaid Other | Attending: Internal Medicine | Admitting: Internal Medicine

## 2020-05-06 DIAGNOSIS — Z20822 Contact with and (suspected) exposure to covid-19: Secondary | ICD-10-CM | POA: Diagnosis not present

## 2020-05-06 DIAGNOSIS — G43009 Migraine without aura, not intractable, without status migrainosus: Secondary | ICD-10-CM

## 2020-05-06 DIAGNOSIS — Z79899 Other long term (current) drug therapy: Secondary | ICD-10-CM | POA: Diagnosis not present

## 2020-05-06 DIAGNOSIS — Z791 Long term (current) use of non-steroidal anti-inflammatories (NSAID): Secondary | ICD-10-CM | POA: Insufficient documentation

## 2020-05-06 LAB — SARS CORONAVIRUS 2 (TAT 6-24 HRS): SARS Coronavirus 2: NEGATIVE

## 2020-05-06 MED ORDER — SUMATRIPTAN SUCCINATE 25 MG PO TABS
25.0000 mg | ORAL_TABLET | Freq: Once | ORAL | 0 refills | Status: DC
Start: 2020-05-06 — End: 2020-12-14

## 2020-05-06 MED ORDER — IBUPROFEN 600 MG PO TABS
600.0000 mg | ORAL_TABLET | Freq: Four times a day (QID) | ORAL | 0 refills | Status: DC | PRN
Start: 2020-05-06 — End: 2020-08-04

## 2020-05-06 MED ORDER — METOCLOPRAMIDE HCL 5 MG/ML IJ SOLN
5.0000 mg | Freq: Once | INTRAMUSCULAR | Status: AC
  Administered 2020-05-06: 5 mg via INTRAMUSCULAR

## 2020-05-06 MED ORDER — SUMATRIPTAN SUCCINATE 6 MG/0.5ML ~~LOC~~ SOLN
6.0000 mg | Freq: Once | SUBCUTANEOUS | Status: AC
  Administered 2020-05-06: 6 mg via SUBCUTANEOUS

## 2020-05-06 MED ORDER — KETOROLAC TROMETHAMINE 30 MG/ML IJ SOLN
30.0000 mg | Freq: Once | INTRAMUSCULAR | Status: AC
  Administered 2020-05-06: 30 mg via INTRAMUSCULAR

## 2020-05-06 NOTE — ED Triage Notes (Signed)
Patient in today w/ c/o H/A and nausea x 6 days. Patient reports no other sxs.   Patient states she is vaccinated against COVID-19.

## 2020-05-06 NOTE — ED Provider Notes (Signed)
[facility]    CSN: [id number] Arrival date & time: 05/06/20  1023      History   Chief Complaint Chief Complaint  Patient presents with  . Headache  . Nausea    HPI Renee Barnett is a 24 y.o. female comes to the urgent care with a 6-day history of frontal headache.  Symptoms started intermittently and has become persistent.  Headache is throbbing in nature.  No radiation of the headache.  It is aggravated by bright light and noise.  Tylenol has not relieved her pain.  She denies any floaters in her visual fields.  No nausea or vomiting.  Patient denies any fever or chills.  No neck pain or stiffness.  No sick contacts.  Patient has been fully vaccinated against COVID-19 virus.  No diarrhea.   HPI  Past Medical History:  Diagnosis Date  . GBS (group B Streptococcus carrier), +RV culture, currently pregnant 10/27/2016  . GERD (gastroesophageal reflux disease)   . Medical history non-contributory   . Preterm premature rupture of membranes (PPROM) with unknown onset of labor 10/29/2016    Patient Active Problem List   Diagnosis Date Noted  . BV (bacterial vaginosis) 04/27/2019  . Vaginal discharge 04/27/2019  . Herpes 12/30/2017    Past Surgical History:  Procedure Laterality Date  . NO PAST SURGERIES      OB History    Gravida  2   Para  2   Term  2   Preterm  0   AB  0   Living  2     SAB  0   TAB  0   Ectopic  0   Multiple  0   Live Births  2            Home Medications    Prior to Admission medications   Medication Sig Start Date End Date Taking? Authorizing Provider  acetaminophen (TYLENOL) 325 MG tablet Take 2 tablets (650 mg total) by mouth every 6 (six) hours as needed. 04/07/20  Yes Darr, Veryl Speak, PA-C  ibuprofen (ADVIL) 600 MG tablet Take 1 tablet (600 mg total) by mouth every 6 (six) hours as needed. 05/06/20   Annalysse Shoemaker, Britta Mccreedy, MD  SUMAtriptan (IMITREX) 25 MG tablet Take 1 tablet (25 mg total) by mouth once for 1 dose.  May repeat in 2 hours if headache persists or recurs. 05/06/20 05/06/20  Merrilee Jansky, MD  ipratropium (ATROVENT) 0.06 % nasal spray Place 2 sprays into both nostrils 4 (four) times daily. Patient not taking: Reported on 03/10/2020 12/24/19 05/06/20  Lurline Idol    Family History Family History  Problem Relation Age of Onset  . Hypertension Mother   . Graves' disease Maternal Aunt   . Diabetes Maternal Grandmother     Social History Social History   Tobacco Use  . Smoking status: Never Smoker  . Smokeless tobacco: Never Used  Vaping Use  . Vaping Use: Never used  Substance Use Topics  . Alcohol use: No    Alcohol/week: 0.0 standard drinks  . Drug use: No     Allergies   Patient has no known allergies.   Review of Systems Review of Systems  Per HPI   Physical Exam Triage Vital Signs ED Triage Vitals  Enc Vitals Group     BP 05/06/20 1042 116/76     Pulse Rate 05/06/20 1042 85     Resp 05/06/20 1042 16     Temp 05/06/20 1042  99.7 F (37.6 C)     Temp Source 05/06/20 1042 Oral     SpO2 05/06/20 1042 100 %     Weight 05/06/20 1044 160 lb (72.6 kg)     Height 05/06/20 1044 5\' 4"  (1.626 m)     Head Circumference --      Peak Flow --      Pain Score 05/06/20 1044 6     Pain Loc --      Pain Edu? --      Excl. in GC? --    No data found.  Updated Vital Signs BP 116/76 (BP Location: Right Arm)   Pulse 85   Temp 99.7 F (37.6 C) (Oral)   Resp 16   Ht 5\' 4"  (1.626 m)   Wt 72.6 kg   SpO2 100%   BMI 27.46 kg/m   Visual Acuity Right Eye Distance:   Left Eye Distance:   Bilateral Distance:    Right Eye Near:   Left Eye Near:    Bilateral Near:     Physical Exam Vitals and nursing note reviewed.  Constitutional:      General: She is not in acute distress.    Appearance: She is well-developed. She is not ill-appearing.  Cardiovascular:     Rate and Rhythm: Normal rate and regular rhythm.     Heart sounds: Normal heart sounds.  Pulmonary:      Effort: Pulmonary effort is normal.     Breath sounds: Normal breath sounds.  Abdominal:     General: Bowel sounds are normal.     Palpations: Abdomen is soft.  Skin:    General: Skin is warm.  Neurological:     Mental Status: She is alert.     GCS: GCS eye subscore is 4. GCS verbal subscore is 5. GCS motor subscore is 6.     Cranial Nerves: No cranial nerve deficit, dysarthria or facial asymmetry.     Sensory: No sensory deficit.     Motor: No weakness.     Deep Tendon Reflexes: Reflexes normal. Babinski sign absent on the right side. Babinski sign absent on the left side.      UC Treatments / Results  Labs (all labs ordered are listed, but only abnormal results are displayed) Labs Reviewed  SARS CORONAVIRUS 2 (TAT 6-24 HRS)    EKG   Radiology No results found.  Procedures Procedures (including critical care time)  Medications Ordered in UC Medications  ketorolac (TORADOL) 30 MG/ML injection 30 mg (30 mg Intramuscular Given 05/06/20 1207)  metoCLOPramide (REGLAN) injection 5 mg (5 mg Intramuscular Given 05/06/20 1204)  SUMAtriptan (IMITREX) injection 6 mg (6 mg Subcutaneous Given 05/06/20 1207)    Initial Impression / Assessment and Plan / UC Course  I have reviewed the triage vital signs and the nursing notes.  Pertinent labs & imaging results that were available during my care of the patient were reviewed by me and considered in my medical decision making (see chart for details).    1.  Acute migraine without aura: Headache cocktail i.e. Toradol 30 mg IM x1 dose, Reglan 5 mg IM x1 dose and Imitrex orally once patient gets home.  She is driving and is worried that she might get altered with the Imitrex subcu. COVID-19 PCR test has been done Please quarantine until COVID-19 test results available. Ibuprofen as needed for headaches Oral Imitrex as needed for headaches Return precautions given Final Clinical Impressions(s) / UC Diagnoses   Final diagnoses:  Migraine without aura and without status migrainosus, not intractable   Discharge Instructions   None    ED Prescriptions    Medication Sig Dispense Auth. Provider   SUMAtriptan (IMITREX) 25 MG tablet Take 1 tablet (25 mg total) by mouth once for 1 dose. May repeat in 2 hours if headache persists or recurs. 10 tablet Bev Drennen, Britta Mccreedy, MD   ibuprofen (ADVIL) 600 MG tablet Take 1 tablet (600 mg total) by mouth every 6 (six) hours as needed. 30 tablet Daimen Shovlin, Britta Mccreedy, MD     PDMP not reviewed this encounter.   Merrilee Jansky, MD 05/06/20 1350

## 2020-07-06 ENCOUNTER — Ambulatory Visit
Admission: EM | Admit: 2020-07-06 | Discharge: 2020-07-06 | Disposition: A | Discharge status: Home / Self Care | Payer: Medicaid Other | Attending: Family Medicine | Admitting: Family Medicine

## 2020-07-06 ENCOUNTER — Encounter: Payer: Self-pay | Admitting: Emergency Medicine

## 2020-07-06 ENCOUNTER — Other Ambulatory Visit: Payer: Self-pay

## 2020-07-06 DIAGNOSIS — J029 Acute pharyngitis, unspecified: Secondary | ICD-10-CM | POA: Diagnosis not present

## 2020-07-06 DIAGNOSIS — Z20822 Contact with and (suspected) exposure to covid-19: Secondary | ICD-10-CM | POA: Diagnosis not present

## 2020-07-06 LAB — RESP PANEL BY RT-PCR (FLU A&B, COVID) ARPGX2
Influenza A by PCR: NEGATIVE
Influenza B by PCR: NEGATIVE
SARS Coronavirus 2 by RT PCR: NEGATIVE

## 2020-07-06 LAB — GROUP A STREP BY PCR: Group A Strep by PCR: NOT DETECTED

## 2020-07-06 NOTE — ED Triage Notes (Signed)
Patient c/o sore throat that started on Saturday.  Patient denies any other cold symptoms.  Patient denies fevers.

## 2020-07-06 NOTE — ED Provider Notes (Signed)
[facility]    CSN: [id number] Arrival date & time: 07/06/20  0806      History   Chief Complaint Chief Complaint  Patient presents with  . Sore Throat   HPI  24 year old female presents with sore throat.  Started yesterday.  Patient denies any other respiratory symptoms.  No fever.  Daughter is also having similar symptoms.  Pain currently 3/10 in severity.  No relieving factors.  No other complaints.  Past Medical History:  Diagnosis Date  . GBS (group B Streptococcus carrier), +RV culture, currently pregnant 10/27/2016  . GERD (gastroesophageal reflux disease)   . Medical history non-contributory   . Preterm premature rupture of membranes (PPROM) with unknown onset of labor 10/29/2016    Patient Active Problem List   Diagnosis Date Noted  . BV (bacterial vaginosis) 04/27/2019  . Vaginal discharge 04/27/2019  . Herpes 12/30/2017    Past Surgical History:  Procedure Laterality Date  . NO PAST SURGERIES      OB History    Gravida  2   Para  2   Term  2   Preterm  0   AB  0   Living  2     SAB  0   TAB  0   Ectopic  0   Multiple  0   Live Births  2            Home Medications    Prior to Admission medications   Medication Sig Start Date End Date Taking? Authorizing Provider  etonogestrel (NEXPLANON) 68 MG IMPL implant 1 each by Subdermal route once.   Yes [provider]  acetaminophen (TYLENOL) 325 MG tablet Take 2 tablets (650 mg total) by mouth every 6 (six) hours as needed. 04/07/20   Darr, Gerilyn Pilgrim, PA-C  ibuprofen (ADVIL) 600 MG tablet Take 1 tablet (600 mg total) by mouth every 6 (six) hours as needed. 05/06/20   Lamptey, Britta Mccreedy, MD  SUMAtriptan (IMITREX) 25 MG tablet Take 1 tablet (25 mg total) by mouth once for 1 dose. May repeat in 2 hours if headache persists or recurs. 05/06/20 05/06/20  Merrilee Jansky, MD  ipratropium (ATROVENT) 0.06 % nasal spray Place 2 sprays into both nostrils 4 (four) times daily. Patient  not taking: Reported on 03/10/2020 12/24/19 05/06/20  Lurline Idol    Family History Family History  Problem Relation Age of Onset  . Hypertension Mother   . Graves' disease Maternal Aunt   . Diabetes Maternal Grandmother     Social History Social History   Tobacco Use  . Smoking status: Never Smoker  . Smokeless tobacco: Never Used  Vaping Use  . Vaping Use: Never used  Substance Use Topics  . Alcohol use: No    Alcohol/week: 0.0 standard drinks  . Drug use: No     Allergies   Patient has no known allergies.   Review of Systems Review of Systems  Constitutional: Negative.   HENT: Positive for sore throat.    Physical Exam Triage Vital Signs ED Triage Vitals  Enc Vitals Group     BP 07/06/20 0827 117/80     Pulse Rate 07/06/20 0827 80     Resp 07/06/20 0827 14     Temp 07/06/20 0827 98.4 F (36.9 C)     Temp Source 07/06/20 0827 Oral     SpO2 07/06/20 0827 100 %     Weight 07/06/20 0824 160 lb (72.6 kg)  Height 07/06/20 0824 5\' 4"  (1.626 m)     Head Circumference --      Peak Flow --      Pain Score 07/06/20 0824 3     Pain Loc --      Pain Edu? --      Excl. in GC? --    Updated Vital Signs BP 117/80 (BP Location: Left Arm)   Pulse 80   Temp 98.4 F (36.9 C) (Oral)   Resp 14   Ht 5\' 4"  (1.626 m)   Wt 72.6 kg   SpO2 100%   BMI 27.46 kg/m   Visual Acuity Right Eye Distance:   Left Eye Distance:   Bilateral Distance:    Right Eye Near:   Left Eye Near:    Bilateral Near:     Physical Exam Vitals and nursing note reviewed.  Constitutional:      General: She is not in acute distress.    Appearance: Normal appearance. She is not ill-appearing.  HENT:     Head: Normocephalic and atraumatic.     Right Ear: Tympanic membrane normal.     Left Ear: Tympanic membrane normal.     Mouth/Throat:     Pharynx: Posterior oropharyngeal erythema present. No oropharyngeal exudate.  Cardiovascular:     Rate and Rhythm: Normal rate and regular  rhythm.     Heart sounds: No murmur heard.   Pulmonary:     Effort: Pulmonary effort is normal.     Breath sounds: Normal breath sounds. No wheezing, rhonchi or rales.  Neurological:     Mental Status: She is alert.  Psychiatric:        Mood and Affect: Mood normal.        Behavior: Behavior normal.    UC Treatments / Results  Labs (all labs ordered are listed, but only abnormal results are displayed) Labs Reviewed  GROUP A STREP BY PCR  RESP PANEL BY RT-PCR (FLU A&B, COVID) [id number]    EKG   Radiology No results found.  Procedures Procedures (including critical care time)  Medications Ordered in UC Medications - No data to display  Initial Impression / Assessment and Plan / UC Course  I have reviewed the triage vital signs and the nursing notes.  Pertinent labs & imaging results that were available during my care of the patient were reviewed by me and considered in my medical decision making (see chart for details).    24 year old female presents with viral pharyngitis.  Strep negative today.  Advised Tylenol and ibuprofen as needed.  Supportive care.  Final Clinical Impressions(s) / UC Diagnoses   Final diagnoses:  Pharyngitis, unspecified etiology     Discharge Instructions     Tylenol/Ibuprofen as needed.  I will call with the results.  Take care  Dr.    ED Prescriptions    None     PDMP not reviewed this encounter.   [address] [place], [place] Other 07/06/20 [phone]

## 2020-07-06 NOTE — Discharge Instructions (Signed)
Tylenol/Ibuprofen as needed.  I will call with the results.  Take care  Dr. Adriana Simas

## 2020-07-11 ENCOUNTER — Ambulatory Visit: Admit: 2020-07-11 | Discharge status: Against Medical Advice | Payer: Medicaid Other

## 2020-07-18 ENCOUNTER — Ambulatory Visit: Payer: Medicaid Other

## 2020-07-30 ENCOUNTER — Ambulatory Visit: Payer: Medicaid Other

## 2020-08-04 ENCOUNTER — Other Ambulatory Visit: Payer: Self-pay

## 2020-08-04 ENCOUNTER — Ambulatory Visit
Admission: EM | Admit: 2020-08-04 | Discharge: 2020-08-04 | Disposition: A | Discharge status: Home / Self Care | Payer: Medicaid Other | Attending: Emergency Medicine | Admitting: Emergency Medicine

## 2020-08-04 DIAGNOSIS — J069 Acute upper respiratory infection, unspecified: Secondary | ICD-10-CM | POA: Diagnosis not present

## 2020-08-04 DIAGNOSIS — Z20822 Contact with and (suspected) exposure to covid-19: Secondary | ICD-10-CM | POA: Diagnosis not present

## 2020-08-04 MED ORDER — IBUPROFEN 800 MG PO TABS
800.0000 mg | ORAL_TABLET | Freq: Three times a day (TID) | ORAL | 0 refills | Status: DC
Start: 2020-08-04 — End: 2021-12-29

## 2020-08-04 NOTE — Discharge Instructions (Signed)
Covid test pending, monitor my chart result Tylenol and ibuprofen for headaches, body aches fevers Continue over-the-counter medicine for cough and congestion Rest and fluids Follow-up if not improving or worsening

## 2020-08-04 NOTE — ED Triage Notes (Signed)
Pt c/o headache and nasal congestion x3 days after going out on new years eve. No known covid exposure.

## 2020-08-04 NOTE — ED Provider Notes (Signed)
[facility]    CSN: [id number] Arrival date & time: 08/04/20  1812      History   Chief Complaint Chief Complaint  Patient presents with  . Headache    HPI Renee Barnett is a 25 y.o. female presenting today for evaluation of headache and congestion.  Reports over the past 3 days has had some URI symptoms.  Denies any fevers or chills.  Has had some mild body aches.  Denies any known Covid exposure, but symptoms began after going out on New Year's Eve.  HPI  Past Medical History:  Diagnosis Date  . GBS (group B Streptococcus carrier), +RV culture, currently pregnant 10/27/2016  . GERD (gastroesophageal reflux disease)   . Medical history non-contributory   . Preterm premature rupture of membranes (PPROM) with unknown onset of labor 10/29/2016    Patient Active Problem List   Diagnosis Date Noted  . BV (bacterial vaginosis) 04/27/2019  . Vaginal discharge 04/27/2019  . Herpes 12/30/2017    Past Surgical History:  Procedure Laterality Date  . NO PAST SURGERIES      OB History    Gravida  2   Para  2   Term  2   Preterm  0   AB  0   Living  2     SAB  0   IAB  0   Ectopic  0   Multiple  0   Live Births  2            Home Medications    Prior to Admission medications   Medication Sig Start Date End Date Taking? Authorizing Provider  ibuprofen (ADVIL) 800 MG tablet Take 1 tablet (800 mg total) by mouth 3 (three) times daily. 08/04/20  Yes Britney Newstrom C, PA-C  acetaminophen (TYLENOL) 325 MG tablet Take 2 tablets (650 mg total) by mouth every 6 (six) hours as needed. 04/07/20   Darr, Gerilyn Pilgrim, PA-C  etonogestrel (NEXPLANON) 68 MG IMPL implant 1 each by Subdermal route once.    [provider]  SUMAtriptan (IMITREX) 25 MG tablet Take 1 tablet (25 mg total) by mouth once for 1 dose. May repeat in 2 hours if headache persists or recurs. 05/06/20 05/06/20  Merrilee Jansky, MD  ipratropium (ATROVENT) 0.06 % nasal spray Place 2 sprays  into both nostrils 4 (four) times daily. Patient not taking: Reported on 03/10/2020 12/24/19 05/06/20  Lurline Idol    Family History Family History  Problem Relation Age of Onset  . Hypertension Mother   . Graves' disease Maternal Aunt   . Diabetes Maternal Grandmother     Social History Social History   Tobacco Use  . Smoking status: Never Smoker  . Smokeless tobacco: Never Used  Vaping Use  . Vaping Use: Never used  Substance Use Topics  . Alcohol use: No    Alcohol/week: 0.0 standard drinks  . Drug use: No     Allergies   Patient has no known allergies.   Review of Systems Review of Systems  Constitutional: Negative for activity change, appetite change, chills, fatigue and fever.  HENT: Positive for congestion, rhinorrhea and sinus pressure. Negative for ear pain, sore throat and trouble swallowing.   Eyes: Negative for discharge and redness.  Respiratory: Negative for cough, chest tightness and shortness of breath.   Cardiovascular: Negative for chest pain.  Gastrointestinal: Negative for abdominal pain, diarrhea, nausea and vomiting.  Musculoskeletal: Negative for myalgias.  Skin: Negative for rash.  Neurological: Positive  for headaches. Negative for dizziness and light-headedness.     Physical Exam Triage Vital Signs ED Triage Vitals  Enc Vitals Group     BP      Pulse      Resp      Temp      Temp src      SpO2      Weight      Height      Head Circumference      Peak Flow      Pain Score      Pain Loc      Pain Edu?      Excl. in [place]?    No data found.  Updated Vital Signs BP 119/81 (BP Location: Left Arm)   Pulse (!) 58   Temp 98.3 F (36.8 C) (Oral)   Resp 16   Visual Acuity Right Eye Distance:   Left Eye Distance:   Bilateral Distance:    Right Eye Near:   Left Eye Near:    Bilateral Near:     Physical Exam Vitals and nursing note reviewed.  Constitutional:      Appearance: She is well-developed and well-nourished.      Comments: No acute distress  HENT:     Head: Normocephalic and atraumatic.     Ears:     Comments: Bilateral ears without tenderness to palpation of external auricle, tragus and mastoid, EAC's without erythema or swelling, TM's with good bony landmarks and cone of light. Non erythematous.     Nose: Nose normal.     Mouth/Throat:     Comments: Oral mucosa pink and moist, no tonsillar enlargement or exudate. Posterior pharynx patent and nonerythematous, no uvula deviation or swelling. Normal phonation. Eyes:     Conjunctiva/sclera: Conjunctivae normal.  Cardiovascular:     Rate and Rhythm: Normal rate and regular rhythm.  Pulmonary:     Effort: Pulmonary effort is normal. No respiratory distress.     Comments: Breathing comfortably at rest, CTABL, no wheezing, rales or other adventitious sounds auscultated Abdominal:     General: There is no distension.  Musculoskeletal:        General: Normal range of motion.     Cervical back: Neck supple.  Skin:    General: Skin is warm and dry.  Neurological:     Mental Status: She is alert and oriented to person, place, and time.  Psychiatric:        Mood and Affect: Mood and affect normal.      UC Treatments / Results  Labs (all labs ordered are listed, but only abnormal results are displayed) Labs Reviewed  NOVEL CORONAVIRUS, NAA    EKG   Radiology No results found.  Procedures Procedures (including critical care time)  Medications Ordered in UC Medications - No data to display  Initial Impression / Assessment and Plan / UC Course  I have reviewed the triage vital signs and the nursing notes.  Pertinent labs & imaging results that were available during my care of the patient were reviewed by me and considered in my medical decision making (see chart for details).     URI with cough-Covid test pending, recommend symptomatic and supportive care rest and fluids.  Discussed strict return precautions. Patient verbalized  understanding and is agreeable with plan.  Final Clinical Impressions(s) / UC Diagnoses   Final diagnoses:  Encounter for screening laboratory testing for COVID-19 virus  Viral URI with cough     Discharge Instructions  Covid test pending, monitor my chart result Tylenol and ibuprofen for headaches, body aches fevers Continue over-the-counter medicine for cough and congestion Rest and fluids Follow-up if not improving or worsening    ED Prescriptions    Medication Sig Dispense Auth. Provider   ibuprofen (ADVIL) 800 MG tablet Take 1 tablet (800 mg total) by mouth 3 (three) times daily. 21 tablet Sheilia Reznick, Frost C, PA-C     PDMP not reviewed this encounter.   Lew Dawes, [place] 08/04/20 [phone]

## 2020-08-07 LAB — SARS-COV-2, NAA 2 DAY TAT

## 2020-08-07 LAB — NOVEL CORONAVIRUS, NAA: SARS-CoV-2, NAA: NOT DETECTED

## 2020-11-19 ENCOUNTER — Ambulatory Visit: Payer: Medicaid Other

## 2020-12-14 ENCOUNTER — Encounter: Payer: Self-pay | Admitting: Gynecology

## 2020-12-14 ENCOUNTER — Other Ambulatory Visit: Payer: Self-pay

## 2020-12-14 ENCOUNTER — Ambulatory Visit
Admission: EM | Admit: 2020-12-14 | Discharge: 2020-12-14 | Disposition: A | Discharge status: Home / Self Care | Payer: Medicaid Other | Attending: Sports Medicine | Admitting: Sports Medicine

## 2020-12-14 DIAGNOSIS — G43009 Migraine without aura, not intractable, without status migrainosus: Secondary | ICD-10-CM | POA: Diagnosis not present

## 2020-12-14 MED ORDER — SUMATRIPTAN SUCCINATE 100 MG PO TABS: 100.0000 mg | ORAL_TABLET | ORAL | 0 refills | Status: DC | PRN

## 2020-12-14 NOTE — ED Triage Notes (Signed)
Per patient has history of migraines and she is out of her medication and would like a refill.

## 2020-12-14 NOTE — Discharge Instructions (Addendum)
I renewed your Imitrex. Please follow-up with your primary care physician tomorrow. If symptoms worsen please go to the ER.

## 2020-12-15 ENCOUNTER — Ambulatory Visit: Payer: Self-pay

## 2020-12-17 NOTE — ED Provider Notes (Signed)
[facility]    CSN: [id number] Arrival date & time: 12/14/20  1356      History   Chief Complaint Chief Complaint  Patient presents with  . Migraine    HPI Renee Barnett is a 25 y.o. female.   25 year old female who presents for evaluation of some symptoms of a mild migraine headache.  She has no photophobia.  She denies an aura.  She has she has an appointment with her doctor tomorrow to get her migraine medicine but could not wait.  She comes in today hoping to get a refill on her prescription to tide her over till tomorrow.  No vision changes.  No numbness or tingling.  No red flag signs or symptoms elicited on history.     Past Medical History:  Diagnosis Date  . GBS (group B Streptococcus carrier), +RV culture, currently pregnant 10/27/2016  . GERD (gastroesophageal reflux disease)   . Medical history non-contributory   . Preterm premature rupture of membranes (PPROM) with unknown onset of labor 10/29/2016    Patient Active Problem List   Diagnosis Date Noted  . BV (bacterial vaginosis) 04/27/2019  . Vaginal discharge 04/27/2019  . Herpes 12/30/2017    Past Surgical History:  Procedure Laterality Date  . NO PAST SURGERIES      OB History    Gravida  2   Para  2   Term  2   Preterm  0   AB  0   Living  2     SAB  0   IAB  0   Ectopic  0   Multiple  0   Live Births  2            Home Medications    Prior to Admission medications   Medication Sig Start Date End Date Taking? Authorizing Provider  etonogestrel (NEXPLANON) 68 MG IMPL implant 1 each by Subdermal route once.   Yes [provider]  ibuprofen (ADVIL) 800 MG tablet Take 1 tablet (800 mg total) by mouth 3 (three) times daily. 08/04/20  Yes Wieters, Hallie C, PA-C  SUMAtriptan (IMITREX) 100 MG tablet Take 1 tablet (100 mg total) by mouth every 2 (two) hours as needed for migraine. May repeat in 2 hours if headache persists or recurs. 12/14/20  Yes Delton See, MD  acetaminophen (TYLENOL) 325 MG tablet Take 2 tablets (650 mg total) by mouth every 6 (six) hours as needed. 04/07/20   Darr, Gerilyn Pilgrim, PA-C  ipratropium (ATROVENT) 0.06 % nasal spray Place 2 sprays into both nostrils 4 (four) times daily. Patient not taking: Reported on 03/10/2020 12/24/19 05/06/20  Lurline Idol    Family History Family History  Problem Relation Age of Onset  . Hypertension Mother   . Graves' disease Maternal Aunt   . Diabetes Maternal Grandmother     Social History Social History   Tobacco Use  . Smoking status: Never Smoker  . Smokeless tobacco: Never Used  Vaping Use  . Vaping Use: Never used  Substance Use Topics  . Alcohol use: No    Alcohol/week: 0.0 standard drinks  . Drug use: No     Allergies   Patient has no known allergies.   Review of Systems Review of Systems  Constitutional: Negative for chills, diaphoresis and fever.  HENT: Negative for congestion, sinus pressure and sinus pain.   Eyes: Negative for photophobia, pain and visual disturbance.  Respiratory: Negative for cough, shortness of breath and wheezing.  Cardiovascular: Negative for chest pain and palpitations.  Gastrointestinal: Negative for abdominal pain, diarrhea, nausea and vomiting.  Genitourinary: Negative for dysuria.  Musculoskeletal: Negative for neck pain and neck stiffness.  Skin: Negative for color change, pallor, rash and wound.  Neurological: Positive for headaches. Negative for dizziness, seizures, syncope, weakness, light-headedness and numbness.  Psychiatric/Behavioral: Negative for confusion.     Physical Exam Triage Vital Signs ED Triage Vitals  Enc Vitals Group     BP 12/14/20 1427 112/77     Pulse Rate 12/14/20 1427 79     Resp 12/14/20 1427 16     Temp 12/14/20 1427 99.5 F (37.5 C)     Temp Source 12/14/20 1427 Oral     SpO2 12/14/20 1427 100 %     Weight 12/14/20 1430 150 lb (68 kg)     Height --      Head Circumference --      Peak  Flow --      Pain Score 12/14/20 1430 6     Pain Loc --      Pain Edu? --      Excl. in [place]? --    No data found.  Updated Vital Signs BP 112/77 (BP Location: Left Arm)   Pulse 79   Temp 99.5 F (37.5 C) (Oral)   Resp 16   Wt 68 kg   SpO2 100%   BMI 25.75 kg/m   Visual Acuity Right Eye Distance:   Left Eye Distance:   Bilateral Distance:    Right Eye Near:   Left Eye Near:    Bilateral Near:     Physical Exam Vitals and nursing note reviewed.  Constitutional:      General: She is not in acute distress.    Appearance: Normal appearance. She is not ill-appearing, toxic-appearing or diaphoretic.  HENT:     Head: Normocephalic and atraumatic.     Nose: Nose normal.     Mouth/Throat:     Mouth: Mucous membranes are moist.  Eyes:     General: No scleral icterus.       Right eye: No discharge.        Left eye: No discharge.     Extraocular Movements: Extraocular movements intact.     Conjunctiva/sclera: Conjunctivae normal.     Pupils: Pupils are equal, round, and reactive to light.  Cardiovascular:     Rate and Rhythm: Normal rate and regular rhythm.     Pulses: Normal pulses.     Heart sounds: Normal heart sounds. No murmur heard. No friction rub. No gallop.   Pulmonary:     Effort: Pulmonary effort is normal.     Breath sounds: Normal breath sounds. No stridor. No wheezing, rhonchi or rales.  Musculoskeletal:     Cervical back: Normal range of motion and neck supple. No rigidity or tenderness.  Skin:    General: Skin is warm and dry.     Capillary Refill: Capillary refill takes less than 2 seconds.  Neurological:     General: No focal deficit present.     Mental Status: She is alert and oriented to person, place, and time.     GCS: GCS eye subscore is 4. GCS verbal subscore is 5. GCS motor subscore is 6.     Cranial Nerves: Cranial nerves are intact.     Sensory: Sensation is intact.     Motor: Motor function is intact.     Coordination: Coordination is  intact.  Gait: Gait is intact.  Psychiatric:        Mood and Affect: Mood normal.      UC Treatments / Results  Labs (all labs ordered are listed, but only abnormal results are displayed) Labs Reviewed - No data to display  EKG   Radiology No results found.  Procedures Procedures (including critical care time)  Medications Ordered in UC Medications - No data to display  Initial Impression / Assessment and Plan / UC Course  I have reviewed the triage vital signs and the nursing notes.  Pertinent labs & imaging results that were available during my care of the patient were reviewed by me and considered in my medical decision making (see chart for details).  Clinical impression: 25 year old with a history of migraine headaches looking for a prescription refill.  Has a appointment tomorrow but needed medications.  Her symptoms are quite mild.  Her vitals and examination are very reassuring.  Treatment plan: 1.  The findings and treatment plan were discussed in detail with the patient.  Patient was in agreement. 2.  I renewed her Imitrex and gave her 5 tablets. 3.  Educational handouts provided. 4.  Follow-up with her primary care physician tomorrow. 5.  If her symptoms worsen before she can see the primary care provider she should go to the ER. 6.  She was discharged in stable condition with follow-up here as needed.    Final Clinical Impressions(s) / UC Diagnoses   Final diagnoses:  Migraine without aura and without status migrainosus, not intractable   Discharge Instructions   None    ED Prescriptions    Medication Sig Dispense Auth. Provider   SUMAtriptan (IMITREX) 100 MG tablet Take 1 tablet (100 mg total) by mouth every 2 (two) hours as needed for migraine. May repeat in 2 hours if headache persists or recurs. 5 tablet Delton See, MD     PDMP not reviewed this encounter.   Delton See, MD 12/17/20 1540

## 2020-12-25 ENCOUNTER — Ambulatory Visit: Payer: Medicaid Other

## 2021-01-29 ENCOUNTER — Ambulatory Visit: Payer: Medicaid Other

## 2021-02-06 ENCOUNTER — Ambulatory Visit: Payer: Medicaid Other

## 2021-02-12 ENCOUNTER — Ambulatory Visit: Payer: Medicaid Other | Admitting: Advanced Practice Midwife

## 2021-02-12 ENCOUNTER — Other Ambulatory Visit: Payer: Self-pay

## 2021-02-12 ENCOUNTER — Ambulatory Visit (LOCAL_COMMUNITY_HEALTH_CENTER): Payer: Medicaid Other | Admitting: Advanced Practice Midwife

## 2021-02-12 VITALS — BP 123/81 | Ht 64.0 in | Wt 164.4 lb

## 2021-02-12 DIAGNOSIS — Z3049 Encounter for surveillance of other contraceptives: Secondary | ICD-10-CM

## 2021-02-12 DIAGNOSIS — E663 Overweight: Secondary | ICD-10-CM

## 2021-02-12 DIAGNOSIS — Z3009 Encounter for other general counseling and advice on contraception: Secondary | ICD-10-CM

## 2021-02-12 NOTE — Progress Notes (Signed)
Pt here for PE and Nexplanon removal.  Pt is not interested in Contraceptions, at this time.  Nexplanon removed by Provider, without any complications.  Pt declined condoms. Berdie Ogren, RN

## 2021-02-12 NOTE — Progress Notes (Signed)
[facility] DEPARTMENT [facility] [address] Main Number: [phone]    Family Planning Visit- Initial Visit  Subjective:  Renee Barnett is a 25 y.o. SBF nonsmoker  G2P2002   being seen today for an initial annual visit and to discuss contraceptive options.  The patient is currently using Nexplanon for pregnancy prevention. Patient reports she does not want a pregnancy in the next year.  Patient has the following medical conditions has Herpes and Overweight BMI=28.2 on their problem list.  No chief complaint on file.   Patient reports wants Nexplanon removed to "give my body a rest". Nexplanon inserted 10/2016. Doesn't want pregnancy but doesn't want birth control today. LMP 02/10/21. Last sex 01/23/21 without condom; with current partner x 1 year; 1 partner in last 3 mo. Last ETOH 02/02/21 (3 mixed drinks) 3x/mo. Employed 40 hours/wk and living with her kids. Pt can't remember last pap  Patient denies cigs, vaping, cigars, MJ  There is no height or weight on file to calculate BMI. - Patient is eligible for diabetes screening based on BMI and age >34?  not applicable HA1C ordered? not applicable  Patient reports 1  partner/s in last year. Desires STI screening?  No - pt refuses exam or testing  Has patient been screened once for HCV in the past?  No  No results found for: HCVAB  Does the patient have current drug use (including MJ), have a partner with drug use, and/or has been incarcerated since last result? No  If yes-- Screen for HCV through [facility]   Does the patient meet criteria for HBV testing? No  Criteria:  -Household, sexual or needle sharing contact with HBV -History of drug use -HIV positive -Those with known Hep C   Health Maintenance Due  Topic Date Due   HPV VACCINES (1 - 2-dose series) Never done   Hepatitis C Screening  Never done   CHLAMYDIA SCREENING  08/07/2018   PAP-Cervical Cytology Screening  Never done    PAP SMEAR-Modifier  Never done    Review of Systems  All other systems reviewed and are negative.  The following portions of the patient's history were reviewed and updated as appropriate: allergies, current medications, past family history, past medical history, past social history, past surgical history and problem list. Problem list updated.   See flowsheet for other program required questions.  Objective:  There were no vitals filed for this visit.  Physical Exam Constitutional:      Appearance: Normal appearance. She is normal weight.  HENT:     Head: Normocephalic and atraumatic.     Mouth/Throat:     Mouth: Mucous membranes are moist.     Comments: Last dental exam 2021; encouraged exam asap Eyes:     Conjunctiva/sclera: Conjunctivae normal.  Cardiovascular:     Rate and Rhythm: Normal rate and regular rhythm.  Pulmonary:     Effort: Pulmonary effort is normal.     Breath sounds: Normal breath sounds.  Chest:  Breasts:    Right: Normal.     Left: Normal.  Abdominal:     Palpations: Abdomen is soft.     Comments: Soft without masses or tenderness  Genitourinary:    Rectum: Normal.     Comments: Pt refuses exam, cultures, pap, bimanual stating menses is "heavy" and began 2 days ago; states she wants to return for that Musculoskeletal:        General: Normal range of motion.  Cervical back: Normal range of motion and neck supple.  Skin:    General: Skin is warm and dry.  Neurological:     Mental Status: She is alert.  Psychiatric:        Mood and Affect: Mood normal.      Assessment and Plan:  Renee Barnett is a 25 y.o. female presenting to the [facility] Department for an initial annual wellness/contraceptive visit  Contraception counseling: Reviewed all forms of birth control options in the tiered based approach. available including abstinence; over the counter/barrier methods; hormonal contraceptive medication including pill, patch, ring,  injection,contraceptive implant, ECP; hormonal and nonhormonal IUDs; permanent sterilization options including vasectomy and the various tubal sterilization modalities. Risks, benefits, and typical effectiveness rates were reviewed.  Questions were answered.  Written information was also given to the patient to review.  Patient desires nothing for birth control, this was prescribed for patient. She will follow up in  prn for surveillance.  She was told to call with any further questions, or with any concerns about this method of contraception.  Emphasized use of condoms 100% of the time for STI prevention.  Patient was offered ECP. ECP was not accepted by the patient. ECP counseling was not given - see RN documentation  1. Overweight BMI=28.2   2. Family planning Pt needs to return for bimanual, pap, cultures in 1-2 wks. Pt refuses pelvic, pap, cultures  3. Encounter for surveillance of other contraceptive Nexplanon Removal Patient identified, informed consent performed, consent signed.   Appropriate time out taken. Nexplanon site identified.  Area prepped in usual sterile fashon. 3 ml of 1% lidocaine with Epinephrine was used to anesthetize the area at the distal end of the implant and along implant site. A small stab incision was made right beside the implant on the distal portion.  The Nexplanon rod was grasped using straight hemostats/manual and removed without difficulty.  There was minimal blood loss. There were no complications.  Steri-strips were applied over the small incision.  A pressure bandage was applied to reduce any bruising.  The patient tolerated the procedure well and was given post procedure instructions.    Nexplanon:   Counseled patient to take OTC analgesic starting as soon as lidocaine starts to wear off and take regularly for at least 48 hr to decrease discomfort.  Specifically to take with food or milk to decrease stomach upset and for IB 600 mg (3 tablets) every 6 hrs; IB  800 mg (4 tablets) every 8 hrs; or Aleve 2 tablets every 12 hrs.       Return in about 1 week (around 02/19/2021) for pap, bimanual, cultures.  No future appointments.  Alberteen Spindle, CNM

## 2021-02-12 NOTE — Progress Notes (Signed)
See encouter from 02/12/21 same visit for notes

## 2021-02-18 ENCOUNTER — Ambulatory Visit: Payer: Medicaid Other

## 2021-04-07 IMAGING — DX RIGHT THIRD TOE
3 series · 3 of 3 positions shown · non-contrast
Comparison: None.

CLINICAL DATA: Right third toe pain after blunt trauma

EXAM:
RIGHT THIRD TOE

[toe ap]
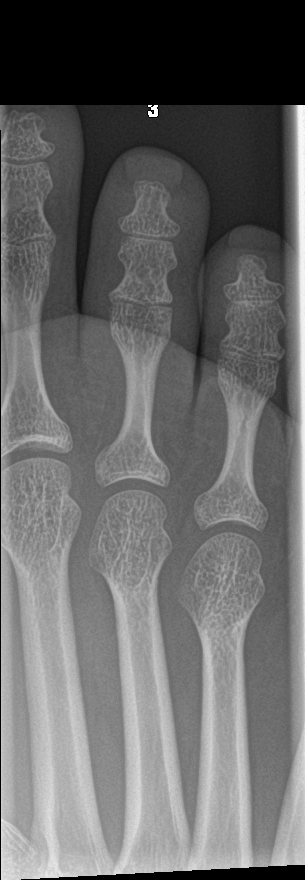

[toe obl]
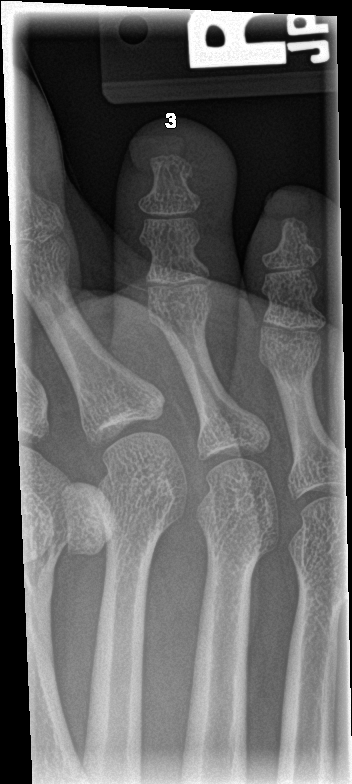

[toe lat]
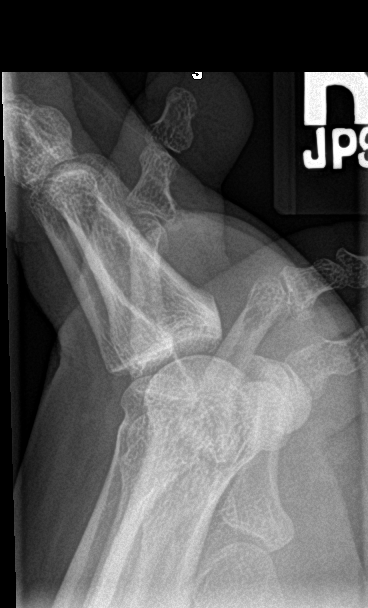

[3 of 3 positions shown; findings below may reference images not displayed]

FINDINGS: There is no evidence of fracture or dislocation. There is no
evidence of arthropathy or other focal bone abnormality. Soft
tissues are unremarkable.
IMPRESSION: Negative.

## 2021-06-05 ENCOUNTER — Other Ambulatory Visit: Payer: Self-pay

## 2021-06-05 ENCOUNTER — Ambulatory Visit
Admission: EM | Admit: 2021-06-05 | Discharge: 2021-06-05 | Disposition: A | Discharge status: Home / Self Care | Payer: Medicaid Other | Attending: Physician Assistant | Admitting: Physician Assistant

## 2021-06-05 DIAGNOSIS — J069 Acute upper respiratory infection, unspecified: Secondary | ICD-10-CM | POA: Diagnosis not present

## 2021-06-05 LAB — POCT INFLUENZA A/B
Influenza A, POC: NEGATIVE
Influenza B, POC: NEGATIVE

## 2021-06-05 NOTE — ED Provider Notes (Signed)
[facility]    CSN: [id number] Arrival date & time: 06/05/21  0815      History   Chief Complaint Chief Complaint  Patient presents with   Sore Throat    HPI Renee Barnett is a 25 y.o. female.   Patient here today for evaluation of sore throat, nasal congestion and drainage, and cough that started 5 days ago.  She reports that her sister tested positive for the flu last week.  She has had some headache but no body aches.  She denies any fever or chills.  She has not had any vomiting or diarrhea.  She has tried over-the-counter cold and flu medications without resolution.  The history is provided by the patient.  Sore Throat Pertinent negatives include no abdominal pain and no shortness of breath.   Past Medical History:  Diagnosis Date   GBS (group B Streptococcus carrier), +RV culture, currently pregnant 10/27/2016   GERD (gastroesophageal reflux disease)    Medical history non-contributory    Migraines    Preterm premature rupture of membranes (PPROM) with unknown onset of labor 10/29/2016    Patient Active Problem List   Diagnosis Date Noted   Overweight BMI=28.2 02/12/2021   Herpes 12/30/2017    Past Surgical History:  Procedure Laterality Date   NO PAST SURGERIES      OB History     Gravida  2   Para  2   Term  2   Preterm  0   AB  0   Living  2      SAB  0   IAB  0   Ectopic  0   Multiple  0   Live Births  2            Home Medications    Prior to Admission medications   Medication Sig Start Date End Date Taking? Authorizing Provider  acetaminophen (TYLENOL) 325 MG tablet Take 2 tablets (650 mg total) by mouth every 6 (six) hours as needed. Patient not taking: Reported on 02/12/2021 04/07/20   Darr, Gerilyn Pilgrim, PA-C  etonogestrel (NEXPLANON) 68 MG IMPL implant 1 each by Subdermal route once.    [provider]  ibuprofen (ADVIL) 800 MG tablet Take 1 tablet (800 mg total) by mouth 3 (three) times daily. Patient  not taking: Reported on 02/12/2021 08/04/20   Wieters, Fran Lowes C, PA-C  SUMAtriptan (IMITREX) 100 MG tablet Take 1 tablet (100 mg total) by mouth every 2 (two) hours as needed for migraine. May repeat in 2 hours if headache persists or recurs. 12/14/20   Delton See, MD  ipratropium (ATROVENT) 0.06 % nasal spray Place 2 sprays into both nostrils 4 (four) times daily. Patient not taking: Reported on 03/10/2020 12/24/19 05/06/20  Lurline Idol    Family History Family History  Problem Relation Age of Onset   Hypertension Mother    Diabetes Maternal Grandmother    Graves' disease Maternal Aunt     Social History Social History   Tobacco Use   Smoking status: Never   Smokeless tobacco: Never  Vaping Use   Vaping Use: Never used  Substance Use Topics   Alcohol use: Yes    Alcohol/week: 3.0 standard drinks    Types: 3 Standard drinks or equivalent per week    Comment: last use 02/02/21   Drug use: Never     Allergies   Patient has no known allergies.   Review of Systems Review of Systems  Constitutional:  Negative  for chills and fever.  HENT:  Positive for congestion, sinus pressure and sore throat. Negative for ear pain.   Eyes:  Negative for discharge and redness.  Respiratory:  Positive for cough. Negative for shortness of breath and wheezing.   Gastrointestinal:  Negative for abdominal pain, diarrhea, nausea and vomiting.    Physical Exam Triage Vital Signs ED Triage Vitals  Enc Vitals Group     BP 06/05/21 0827 122/81     Pulse Rate 06/05/21 0827 78     Resp 06/05/21 0827 18     Temp 06/05/21 0827 98.4 F (36.9 C)     Temp Source 06/05/21 0827 Oral     SpO2 06/05/21 0827 97 %     Weight --      Height --      Head Circumference --      Peak Flow --      Pain Score 06/05/21 0828 3     Pain Loc --      Pain Edu? --      Excl. in GC? --    No data found.  Updated Vital Signs BP 122/81 (BP Location: Left Arm)   Pulse 78   Temp 98.4 F (36.9 C) (Oral)    Resp 18   SpO2 97%   Physical Exam Vitals and nursing note reviewed.  Constitutional:      General: She is not in acute distress.    Appearance: Normal appearance. She is not ill-appearing.  HENT:     Head: Normocephalic and atraumatic.     Left Ear: Tympanic membrane normal.     Ears:     Comments: Unable to visualize right TM due to cerumen in EAC.    Nose: Congestion present.     Mouth/Throat:     Mouth: Mucous membranes are moist.     Pharynx: No oropharyngeal exudate or posterior oropharyngeal erythema.  Eyes:     Conjunctiva/sclera: Conjunctivae normal.  Cardiovascular:     Rate and Rhythm: Normal rate and regular rhythm.     Heart sounds: Normal heart sounds. No murmur heard. Pulmonary:     Effort: Pulmonary effort is normal. No respiratory distress.     Breath sounds: Normal breath sounds. No wheezing, rhonchi or rales.  Skin:    General: Skin is warm and dry.  Neurological:     Mental Status: She is alert.  Psychiatric:        Mood and Affect: Mood normal.        Thought Content: Thought content normal.     UC Treatments / Results  Labs (all labs ordered are listed, but only abnormal results are displayed) Labs Reviewed  POCT INFLUENZA A/B    EKG   Radiology No results found.  Procedures Procedures (including critical care time)  Medications Ordered in UC Medications - No data to display  Initial Impression / Assessment and Plan / UC Course  I have reviewed the triage vital signs and the nursing notes.  Pertinent labs & imaging results that were available during my care of the patient were reviewed by me and considered in my medical decision making (see chart for details).  Suspect likely viral etiology of symptoms.  Flu test negative in office.   Given duration of symptoms deferred further screening for COVID.  Recommended symptomatic treatment and follow-up if symptoms fail to improve or worsen.  Final Clinical Impressions(s) / UC Diagnoses    Final diagnoses:  Acute upper respiratory infection   Discharge  Instructions   None    ED Prescriptions   None    PDMP not reviewed this encounter.   Tomi Bamberger, PA-C 06/05/21 [phone]

## 2021-06-05 NOTE — ED Triage Notes (Signed)
Pt c/o sore throat, nasal congestion with drainage, cough, headache.  Denies nausea, vomiting, diarrhea, constipation, body aches or chills.   Onset Monday. Sister was dx with flu last week.

## 2021-12-29 ENCOUNTER — Ambulatory Visit
Admission: RE | Admit: 2021-12-29 | Discharge: 2021-12-29 | Disposition: A | Discharge status: Home / Self Care | Payer: Medicaid Other | Source: Ambulatory Visit | Attending: Family Medicine | Admitting: Family Medicine

## 2021-12-29 VITALS — BP 123/79 | HR 97 | Temp 98.3°F | Resp 18

## 2021-12-29 DIAGNOSIS — L739 Follicular disorder, unspecified: Secondary | ICD-10-CM | POA: Diagnosis not present

## 2021-12-29 NOTE — ED Provider Notes (Signed)
[facility]    CSN: [id number] Arrival date & time: 12/29/21  1038    HISTORY   Chief Complaint  Patient presents with   Finger Injury    Finger swollen - Entered by patient   HPI Renee Barnett is a 26 y.o. female. Patient presents urgent care complaining of a lump on her right index finger that is warm, swollen and tender to touch, states it appeared a few days ago and seems to be getting bigger.  Patient states she is never had cellulitis in her finger before.  Patient denies trauma to her finger.  Patient denies red streaking up her hand.  The history is provided by the patient.  Past Medical History:  Diagnosis Date   GBS (group B Streptococcus carrier), +RV culture, currently pregnant 10/27/2016   GERD (gastroesophageal reflux disease)    Medical history non-contributory    Migraines    Preterm premature rupture of membranes (PPROM) with unknown onset of labor 10/29/2016   Patient Active Problem List   Diagnosis Date Noted   Overweight BMI=28.2 02/12/2021   Herpes 12/30/2017   Past Surgical History:  Procedure Laterality Date   NO PAST SURGERIES     OB History     Gravida  2   Para  2   Term  2   Preterm  0   AB  0   Living  2      SAB  0   IAB  0   Ectopic  0   Multiple  0   Live Births  2          Home Medications    Prior to Admission medications   Medication Sig Start Date End Date Taking? Authorizing Provider  SUMAtriptan (IMITREX) 100 MG tablet Take 1 tablet (100 mg total) by mouth every 2 (two) hours as needed for migraine. May repeat in 2 hours if headache persists or recurs. 12/14/20   Delton See, MD    Family History Family History  Problem Relation Age of Onset   Hypertension Mother    Diabetes Maternal Grandmother    Graves' disease Maternal Aunt    Social History Social History   Tobacco Use   Smoking status: Never   Smokeless tobacco: Never  Vaping Use   Vaping Use: Never used  Substance Use  Topics   Alcohol use: Yes    Alcohol/week: 3.0 standard drinks    Types: 3 Standard drinks or equivalent per week    Comment: last use 02/02/21   Drug use: Never   Allergies   Patient has no known allergies.  Review of Systems Review of Systems Pertinent findings noted in history of present illness.   Physical Exam Triage Vital Signs ED Triage Vitals  Enc Vitals Group     BP 05/29/21 0827 (!) 147/82     Pulse Rate 05/29/21 0827 72     Resp 05/29/21 0827 18     Temp 05/29/21 0827 98.3 F (36.8 C)     Temp Source 05/29/21 0827 Oral     SpO2 05/29/21 0827 98 %     Weight --      Height --      Head Circumference --      Peak Flow --      Pain Score 05/29/21 0826 5     Pain Loc --      Pain Edu? --      Excl. in GC? --   No data found.  Updated Vital Signs BP 123/79 (BP Location: Right Arm)   Pulse 97   Temp 98.3 F (36.8 C) (Oral)   Resp 18   LMP 12/07/2021   SpO2 98%   Physical Exam Vitals and nursing note reviewed.  Constitutional:      General: She is not in acute distress.    Appearance: Normal appearance. She is not ill-appearing.  HENT:     Head: Normocephalic and atraumatic.  Eyes:     General: Lids are normal.        Right eye: No discharge.        Left eye: No discharge.     Extraocular Movements: Extraocular movements intact.     Conjunctiva/sclera: Conjunctivae normal.     Right eye: Right conjunctiva is not injected.     Left eye: Left conjunctiva is not injected.  Neck:     Trachea: Trachea and phonation normal.  Cardiovascular:     Rate and Rhythm: Normal rate and regular rhythm.     Pulses: Normal pulses.     Heart sounds: Normal heart sounds. No murmur heard.   No friction rub. No gallop.  Pulmonary:     Effort: Pulmonary effort is normal. No accessory muscle usage, prolonged expiration or respiratory distress.     Breath sounds: Normal breath sounds. No stridor, decreased air movement or transmitted upper airway sounds. No decreased  breath sounds, wheezing, rhonchi or rales.  Chest:     Chest wall: No tenderness.  Musculoskeletal:        General: Normal range of motion.     Cervical back: Normal range of motion and neck supple. Normal range of motion.  Lymphadenopathy:     Cervical: No cervical adenopathy.  Skin:    General: Skin is warm and dry.     Findings: Lesion (Superficial skin infection at the posterior aspect of the proximal phalanx of right index finger) present. No erythema or rash.  Neurological:     General: No focal deficit present.     Mental Status: She is alert and oriented to person, place, and time.  Psychiatric:        Mood and Affect: Mood normal.        Behavior: Behavior normal.    Visual Acuity Right Eye Distance:   Left Eye Distance:   Bilateral Distance:    Right Eye Near:   Left Eye Near:    Bilateral Near:     UC Couse / Diagnostics / Procedures:    EKG  Radiology No results found.  Procedures Procedures (including critical care time)  UC Diagnoses / Final Clinical Impressions(s)   I have reviewed the triage vital signs and the nursing notes.  Pertinent labs & imaging results that were available during my care of the patient were reviewed by me and considered in my medical decision making (see chart for details).    Final diagnoses:  Superficial folliculitis   Patient provided with a few packets of Neosporin and a Band-Aid, advised to change once or twice a day..  Patient advised to monitor the wound for signs of increased redness, increased pain, purulent drainage, return if any of these occur.  ED Prescriptions   None    PDMP not reviewed this encounter.  Pending results:  Labs Reviewed - No data to display  Medications Ordered in UC: Medications - No data to display  Disposition Upon Discharge:  Condition: stable for discharge home Home: take medications as prescribed; routine discharge instructions as discussed;  follow up as advised.  Patient  presented with an acute illness with associated systemic symptoms and significant discomfort requiring urgent management. In my opinion, this is a condition that a prudent lay person (someone who possesses an average knowledge of health and medicine) may potentially expect to result in complications if not addressed urgently such as respiratory distress, impairment of bodily function or dysfunction of bodily organs.   Routine symptom specific, illness specific and/or disease specific instructions were discussed with the patient and/or caregiver at length.   As such, the patient has been evaluated and assessed, work-up was performed and treatment was provided in alignment with urgent care protocols and evidence based medicine.  Patient/parent/caregiver has been advised that the patient may require follow up for further testing and treatment if the symptoms continue in spite of treatment, as clinically indicated and appropriate.  Patient/parent/caregiver has been [facility] if no better; to PCP or the Emergency Department if new signs and symptoms develop, or if the current signs or symptoms continue to change or worsen for further workup, evaluation and treatment as clinically indicated and appropriate  The patient will follow up with their current PCP if and as advised. If the patient does not currently have a PCP we will assist them in obtaining one.   The patient may need specialty follow up if the symptoms continue, in spite of conservative treatment and management, for further workup, evaluation, consultation and treatment as clinically indicated and appropriate.   Patient/parent/caregiver verbalized understanding and agreement of plan as discussed.  All questions were addressed during visit.  Please see discharge instructions below for further details of plan.  Discharge Instructions:   Discharge Instructions      Please change your bandaging once to twice a day  apply Neosporin in between each bandage change.  Please monitor wound for increasing redness, increasing pain or purulent drainage.  Please follow-up if any these occur.      This office note has been dictated using Teaching laboratory technicianDragon speech recognition software.  Unfortunately, and despite my best efforts, this method of dictation can sometimes lead to occasional typographical or grammatical errors.  I apologize in advance if this occurs.     Theadora RamaMorgan, Harlan Vinal Scales, PA-C 12/29/21 1108

## 2021-12-29 NOTE — ED Triage Notes (Signed)
Pt c/o lump to her right pointer finger.

## 2021-12-29 NOTE — Discharge Instructions (Signed)
Please change your bandaging once to twice a day apply Neosporin in between each bandage change.  Please monitor wound for increasing redness, increasing pain or purulent drainage.  Please follow-up if any these occur.

## 2022-05-10 ENCOUNTER — Ambulatory Visit (INDEPENDENT_AMBULATORY_CARE_PROVIDER_SITE_OTHER): Payer: Medicaid Other | Admitting: *Deleted

## 2022-05-10 VITALS — BP 139/84 | HR 92

## 2022-05-10 DIAGNOSIS — N912 Amenorrhea, unspecified: Secondary | ICD-10-CM | POA: Diagnosis not present

## 2022-05-10 DIAGNOSIS — Z3480 Encounter for supervision of other normal pregnancy, unspecified trimester: Secondary | ICD-10-CM

## 2022-05-10 DIAGNOSIS — Z3201 Encounter for pregnancy test, result positive: Secondary | ICD-10-CM

## 2022-05-10 LAB — POCT URINE PREGNANCY: Preg Test, Ur: POSITIVE — AB

## 2022-05-10 MED ORDER — PROMETHAZINE HCL 25 MG PO TABS: 25.0000 mg | ORAL_TABLET | Freq: Four times a day (QID) | ORAL | 1 refills | Status: DC | PRN

## 2022-05-10 MED ORDER — PRENATAL 28-0.8 MG PO TABS: 1.0000 | ORAL_TABLET | Freq: Every day | ORAL | 12 refills | Status: DC

## 2022-05-10 NOTE — Progress Notes (Signed)
Ms. Dace presents today for UPT. She has no unusual complaints.  LMP: 04/13/22    OBJECTIVE: Appears well, in no apparent distress.  OB History     Gravida  2   Para  2   Term  2   Preterm  0   AB  0   Living  2      SAB  0   IAB  0   Ectopic  0   Multiple  0   Live Births  2          Home UPT Result: positive In-Office UPT result: positive I have reviewed the patient's medical, obstetrical, social, and family histories, and medications.   ASSESSMENT: Positive pregnancy test  PLAN Prenatal care to be completed at: [facility]. OB Intake with [place] for viability and dating scheduled at checkout.  RX PNV and Phenergan sent. Ectopic and SAB precautions reviewed.

## 2022-06-03 ENCOUNTER — Inpatient Hospital Stay (HOSPITAL_COMMUNITY)
Admission: AD | Admit: 2022-06-03 | Discharge: 2022-06-03 | Disposition: A | Discharge status: Home / Self Care | Payer: Medicaid Other | Attending: Obstetrics and Gynecology | Admitting: Obstetrics and Gynecology

## 2022-06-03 ENCOUNTER — Encounter (HOSPITAL_COMMUNITY): Payer: Self-pay | Admitting: Obstetrics and Gynecology

## 2022-06-03 ENCOUNTER — Other Ambulatory Visit: Payer: Self-pay

## 2022-06-03 DIAGNOSIS — R519 Headache, unspecified: Secondary | ICD-10-CM | POA: Insufficient documentation

## 2022-06-03 DIAGNOSIS — R1084 Generalized abdominal pain: Secondary | ICD-10-CM

## 2022-06-03 DIAGNOSIS — Z79899 Other long term (current) drug therapy: Secondary | ICD-10-CM | POA: Diagnosis not present

## 2022-06-03 DIAGNOSIS — R102 Pelvic and perineal pain: Secondary | ICD-10-CM | POA: Diagnosis not present

## 2022-06-03 DIAGNOSIS — Z3A01 Less than 8 weeks gestation of pregnancy: Secondary | ICD-10-CM | POA: Diagnosis not present

## 2022-06-03 DIAGNOSIS — O26891 Other specified pregnancy related conditions, first trimester: Secondary | ICD-10-CM | POA: Diagnosis present

## 2022-06-03 DIAGNOSIS — R109 Unspecified abdominal pain: Secondary | ICD-10-CM | POA: Diagnosis not present

## 2022-06-03 LAB — COMPREHENSIVE METABOLIC PANEL
ALT: 15 U/L (ref 0–44)
AST: 19 U/L (ref 15–41)
Albumin: 3.9 g/dL (ref 3.5–5.0)
Alkaline Phosphatase: 50 U/L (ref 38–126)
Anion gap: 10 (ref 5–15)
BUN: 7 mg/dL (ref 6–20)
CO2: 21 mmol/L — ABNORMAL LOW (ref 22–32)
Calcium: 9.5 mg/dL (ref 8.9–10.3)
Chloride: 107 mmol/L (ref 98–111)
Creatinine, Ser: 0.79 mg/dL (ref 0.44–1.00)
GFR, Estimated: >60 mL/min (ref >60)
Glucose, Bld: 89 mg/dL (ref 70–99)
Potassium: 3.5 mmol/L (ref 3.5–5.1)
Sodium: 138 mmol/L (ref 135–145)
Total Bilirubin: 1 mg/dL (ref 0.3–1.2)
Total Protein: 6.8 g/dL (ref 6.5–8.1)

## 2022-06-03 LAB — CBC WITH DIFFERENTIAL/PLATELET
Abs Immature Granulocytes: 0.03 10*3/uL (ref 0.00–0.07)
Basophils Absolute: 0 10*3/uL (ref 0.0–0.1)
Basophils Relative: 1 %
Eosinophils Absolute: 0.1 10*3/uL (ref 0.0–0.5)
Eosinophils Relative: 1 %
HCT: 37.9 % (ref 36.0–46.0)
Hemoglobin: 12.6 g/dL (ref 12.0–15.0)
Immature Granulocytes: 0 %
Lymphocytes Relative: 24 %
Lymphs Abs: 2.1 10*3/uL (ref 0.7–4.0)
MCH: 28.5 pg (ref 26.0–34.0)
MCHC: 33.2 g/dL (ref 30.0–36.0)
MCV: 85.7 fL (ref 80.0–100.0)
Monocytes Absolute: 0.6 10*3/uL (ref 0.1–1.0)
Monocytes Relative: 7 %
Neutro Abs: 5.9 10*3/uL (ref 1.7–7.7)
Neutrophils Relative %: 67 %
Platelets: 211 10*3/uL (ref 150–400)
RBC: 4.42 MIL/uL (ref 3.87–5.11)
RDW: 13.1 % (ref 11.5–15.5)
WBC: 8.7 10*3/uL (ref 4.0–10.5)
nRBC: 0 % (ref 0.0–0.2)

## 2022-06-03 LAB — URINALYSIS, ROUTINE W REFLEX MICROSCOPIC
Bilirubin Urine: NEGATIVE
Glucose, UA: NEGATIVE mg/dL
Hgb urine dipstick: NEGATIVE
Ketones, ur: NEGATIVE mg/dL
Leukocytes,Ua: NEGATIVE
Nitrite: NEGATIVE
Protein, ur: NEGATIVE mg/dL
Specific Gravity, Urine: 1.027 (ref 1.005–1.030)
pH: 6 (ref 5.0–8.0)

## 2022-06-03 MED ORDER — ACETAMINOPHEN 500 MG PO TABS
1000.0000 mg | ORAL_TABLET | Freq: Once | ORAL | Status: AC
  Administered 2022-06-03: 1000 mg via ORAL
  Filled 2022-06-03: qty 2

## 2022-06-03 NOTE — Progress Notes (Signed)
CSW completed chart review and was preparing to complete clinical assessment with patient.  RN called and communicated that patient was being discharging and an "ambulatory referral" will be made.  Laurey Arrow, MSW, LCSW Clinical Social Work [phone]
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
## 2022-06-03 NOTE — MAU Note (Signed)
Discussed pt with Glenard Haring CSW, will come see pt
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
## 2022-06-11 ENCOUNTER — Other Ambulatory Visit: Payer: Self-pay

## 2022-06-11 ENCOUNTER — Encounter (HOSPITAL_BASED_OUTPATIENT_CLINIC_OR_DEPARTMENT_OTHER): Payer: Self-pay | Admitting: Emergency Medicine

## 2022-06-11 ENCOUNTER — Telehealth: Payer: Self-pay | Admitting: Emergency Medicine

## 2022-06-11 ENCOUNTER — Emergency Department (HOSPITAL_BASED_OUTPATIENT_CLINIC_OR_DEPARTMENT_OTHER)
Admission: EM | Admit: 2022-06-11 | Discharge: 2022-06-11 | Disposition: A | Discharge status: Home / Self Care | Payer: Medicaid Other | Attending: Emergency Medicine | Admitting: Emergency Medicine

## 2022-06-11 DIAGNOSIS — Z3A00 Weeks of gestation of pregnancy not specified: Secondary | ICD-10-CM | POA: Insufficient documentation

## 2022-06-11 DIAGNOSIS — O09891 Supervision of other high risk pregnancies, first trimester: Secondary | ICD-10-CM | POA: Diagnosis not present

## 2022-06-11 DIAGNOSIS — O98511 Other viral diseases complicating pregnancy, first trimester: Secondary | ICD-10-CM | POA: Diagnosis present

## 2022-06-11 DIAGNOSIS — U071 COVID-19: Secondary | ICD-10-CM | POA: Insufficient documentation

## 2022-06-11 LAB — SARS CORONAVIRUS 2 BY RT PCR: SARS Coronavirus 2 by RT PCR: POSITIVE — AB

## 2022-06-11 NOTE — Telephone Encounter (Signed)
RC to patient requesting information about Unisom, and B6. Consult with Sharen Counter, pt may take 100mg  of Vit B6 twice daily, and 25mg  of Unisom at night. Pt verbalizes understanding.

## 2022-06-11 NOTE — ED Triage Notes (Signed)
Patient reports at home COVID test was positive and requesting another test be done. Reports body aches and chest tightness.

## 2022-06-11 NOTE — Discharge Instructions (Signed)
Contact a health care provider if: You have signs and symptoms of infection, including a fever or cough. Tell your health care team: That you think you may have a COVID-19 infection. That you are pregnant. You feel very sad or anxious. You feel unsafe in your home and need help finding a safe place to live. You are bleeding from your vagina, or you have bloody or watery discharge from your vagina. Get help right away if: You have signs or symptoms of labor before 37 weeks of pregnancy. These include: Contractions that are 5 minutes or less apart, or that become longer, more frequent, or more intense. Sudden, sharp pain in the abdomen or in the lower back. A gush or trickle of fluid from your vagina. You have signs of more serious illness, such as: Trouble breathing. Chest pain. A fever of 102.56F (39C) or higher that does not go away. Vomiting every time you drink fluids. Feeling extremely weak. Fainting.

## 2022-06-11 NOTE — ED Provider Notes (Signed)
[facility] EMERGENCY DEPARTMENT Provider Note   CSN: [id number] Arrival date & time: 06/11/22  1250     History  Chief Complaint  Patient presents with   Covid Positive    Renee Barnett is a 26 y.o. female presents emergency department with a chief complaint of flulike symptoms.  She is currently in her first trimester of pregnancy.  Yesterday she began having body aches and chills, subjective fever at home.  Her boyfriend has been having similar symptoms since Saturday, 7 days ago.  She did not take anything prior to arrival.  She denies nausea, vomiting, diarrhea, wheezing or shortness of breath  HPI     Home Medications Prior to Admission medications   Medication Sig Start Date End Date Taking? Authorizing Provider  Prenatal 28-0.8 MG TABS Take 1 tablet by mouth daily. 05/10/22   Constant, Peggy, MD  promethazine (PHENERGAN) 25 MG tablet Take 1 tablet (25 mg total) by mouth every 6 (six) hours as needed for nausea or vomiting. 05/10/22   Constant, Peggy, MD  ipratropium (ATROVENT) 0.06 % nasal spray Place 2 sprays into both nostrils 4 (four) times daily. Patient not taking: Reported on 03/10/2020 12/24/19 05/06/20  Belinda Fisher, PA-C      Allergies    Patient has no known allergies.    Review of Systems   Review of Systems  Physical Exam Updated Vital Signs BP 129/74 (BP Location: Right Arm)   Pulse (!) 103   Temp 100.2 F (37.9 C) (Oral)   Resp 17   Ht 5\' 4"  (1.626 m)   Wt 89.4 kg   LMP 04/13/2022 (Exact Date)   SpO2 97%   BMI 33.81 kg/m  Physical Exam Vitals and nursing note reviewed.  Constitutional:      General: She is not in acute distress.    Appearance: She is well-developed. She is not diaphoretic.  HENT:     Head: Normocephalic and atraumatic.     Right Ear: External ear normal.     Left Ear: External ear normal.     Nose: Nose normal.     Mouth/Throat:     Mouth: Mucous membranes are moist.  Eyes:     General: No scleral icterus.     Conjunctiva/sclera: Conjunctivae normal.  Cardiovascular:     Rate and Rhythm: Regular rhythm. Tachycardia present.     Heart sounds: Normal heart sounds. No murmur heard.    No friction rub. No gallop.  Pulmonary:     Effort: Pulmonary effort is normal. No respiratory distress.     Breath sounds: Normal breath sounds. No wheezing.  Abdominal:     General: Bowel sounds are normal. There is no distension.     Palpations: Abdomen is soft. There is no mass.     Tenderness: There is no abdominal tenderness. There is no guarding.  Musculoskeletal:     Cervical back: Normal range of motion.  Skin:    General: Skin is warm and dry.  Neurological:     Mental Status: She is alert and oriented to person, place, and time.  Psychiatric:        Behavior: Behavior normal.     ED Results / Procedures / Treatments   Labs (all labs ordered are listed, but only abnormal results are displayed) Labs Reviewed  SARS CORONAVIRUS 2 BY RT PCR - Abnormal; Notable for the following components:      Result Value   SARS Coronavirus 2 by RT PCR POSITIVE (*)  All other components within normal limits    EKG None  Radiology No results found.  Procedures Procedures    Medications Ordered in ED Medications - No data to display  ED Course/ Medical Decision Making/ A&P                           Medical Decision Making Patient here care in early pregnancy, positive for COVID-19.  She has no hypoxia, borderline febrile.  Although nursing triage note states she has chest tightness she denies any to me.  She is otherwise well-appearing.  We will discharge her with symptomatic treatment including Tylenol, fluids and rest, work note given, return precautions and outpatient follow-up instructions also given.           Final Clinical Impression(s) / ED Diagnoses Final diagnoses:  COVID-19 virus infection    Rx / DC Orders ED Discharge Orders     None         Arthor Captain,  PA-C 06/11/22 1616    Rondel Baton, MD 06/12/22 1727

## 2022-06-20 ENCOUNTER — Inpatient Hospital Stay (HOSPITAL_COMMUNITY)
Admission: AD | Admit: 2022-06-20 | Discharge: 2022-06-21 | Disposition: A | Discharge status: Home / Self Care | Payer: Medicaid Other | Attending: Family Medicine | Admitting: Family Medicine

## 2022-06-20 DIAGNOSIS — O98511 Other viral diseases complicating pregnancy, first trimester: Secondary | ICD-10-CM | POA: Insufficient documentation

## 2022-06-20 DIAGNOSIS — O99820 Streptococcus B carrier state complicating pregnancy: Secondary | ICD-10-CM | POA: Insufficient documentation

## 2022-06-20 DIAGNOSIS — O210 Mild hyperemesis gravidarum: Secondary | ICD-10-CM | POA: Insufficient documentation

## 2022-06-20 DIAGNOSIS — Z3A09 9 weeks gestation of pregnancy: Secondary | ICD-10-CM | POA: Insufficient documentation

## 2022-06-20 DIAGNOSIS — O09291 Supervision of pregnancy with other poor reproductive or obstetric history, first trimester: Secondary | ICD-10-CM | POA: Insufficient documentation

## 2022-06-20 NOTE — MAU Note (Addendum)
..  Renee Barnett is a 26 y.o. at [redacted]w[redacted]d here in MAU reporting: N/V since Saturday 11/18. Has vomited 3 times today. Reports headache that began yesterday, took tylenol but could not keep it down.  Denies abdominal pain or vaginal bleeding.  Reports she tested positive for COVID on 06/11/2022 and completed quarantine 06/16/2022.  Pain score: 5/10 Vitals:   06/20/22 2343  BP: 129/77  Pulse: 93  Resp: 16  Temp: 98.6 F (37 C)  SpO2: 100%     FHT:not indicated Lab orders placed from triage:  ua

## 2022-06-21 ENCOUNTER — Encounter (HOSPITAL_COMMUNITY): Payer: Self-pay | Admitting: Family Medicine

## 2022-06-21 DIAGNOSIS — O09291 Supervision of pregnancy with other poor reproductive or obstetric history, first trimester: Secondary | ICD-10-CM | POA: Diagnosis not present

## 2022-06-21 DIAGNOSIS — O98511 Other viral diseases complicating pregnancy, first trimester: Secondary | ICD-10-CM | POA: Diagnosis present

## 2022-06-21 DIAGNOSIS — O210 Mild hyperemesis gravidarum: Secondary | ICD-10-CM

## 2022-06-21 DIAGNOSIS — O99820 Streptococcus B carrier state complicating pregnancy: Secondary | ICD-10-CM | POA: Diagnosis not present

## 2022-06-21 DIAGNOSIS — Z3A09 9 weeks gestation of pregnancy: Secondary | ICD-10-CM | POA: Diagnosis not present

## 2022-06-21 LAB — URINALYSIS, ROUTINE W REFLEX MICROSCOPIC
Bacteria, UA: NONE SEEN
Bilirubin Urine: NEGATIVE
Glucose, UA: NEGATIVE mg/dL
Hgb urine dipstick: NEGATIVE
Ketones, ur: 80 mg/dL — AB
Leukocytes,Ua: NEGATIVE
Nitrite: NEGATIVE
Protein, ur: 30 mg/dL — AB
Specific Gravity, Urine: 1.027 (ref 1.005–1.030)
pH: 5 (ref 5.0–8.0)

## 2022-06-21 LAB — CBC WITH DIFFERENTIAL/PLATELET
Abs Immature Granulocytes: 0.04 10*3/uL (ref 0.00–0.07)
Basophils Absolute: 0 10*3/uL (ref 0.0–0.1)
Basophils Relative: 0 %
Eosinophils Absolute: 0 10*3/uL (ref 0.0–0.5)
Eosinophils Relative: 1 %
HCT: 37.1 % (ref 36.0–46.0)
Hemoglobin: 12.8 g/dL (ref 12.0–15.0)
Immature Granulocytes: 1 %
Lymphocytes Relative: 21 %
Lymphs Abs: 1.8 10*3/uL (ref 0.7–4.0)
MCH: 28.5 pg (ref 26.0–34.0)
MCHC: 34.5 g/dL (ref 30.0–36.0)
MCV: 82.6 fL (ref 80.0–100.0)
Monocytes Absolute: 0.5 10*3/uL (ref 0.1–1.0)
Monocytes Relative: 6 %
Neutro Abs: 6.4 10*3/uL (ref 1.7–7.7)
Neutrophils Relative %: 71 %
Platelets: 238 10*3/uL (ref 150–400)
RBC: 4.49 MIL/uL (ref 3.87–5.11)
RDW: 12.4 % (ref 11.5–15.5)
WBC: 8.8 10*3/uL (ref 4.0–10.5)
nRBC: 0 % (ref 0.0–0.2)

## 2022-06-21 LAB — COMPREHENSIVE METABOLIC PANEL
ALT: 31 U/L (ref 0–44)
AST: 26 U/L (ref 15–41)
Albumin: 3.9 g/dL (ref 3.5–5.0)
Alkaline Phosphatase: 53 U/L (ref 38–126)
Anion gap: 12 (ref 5–15)
BUN: 10 mg/dL (ref 6–20)
CO2: 19 mmol/L — ABNORMAL LOW (ref 22–32)
Calcium: 9.6 mg/dL (ref 8.9–10.3)
Chloride: 107 mmol/L (ref 98–111)
Creatinine, Ser: 0.71 mg/dL (ref 0.44–1.00)
GFR, Estimated: >60 mL/min (ref >60)
Glucose, Bld: 95 mg/dL (ref 70–99)
Potassium: 3.6 mmol/L (ref 3.5–5.1)
Sodium: 138 mmol/L (ref 135–145)
Total Bilirubin: 1.1 mg/dL (ref 0.3–1.2)
Total Protein: 7.3 g/dL (ref 6.5–8.1)

## 2022-06-21 MED ORDER — ONDANSETRON 4 MG PO TBDP: 4.0000 mg | ORAL_TABLET | Freq: Three times a day (TID) | ORAL | 0 refills | Status: DC | PRN

## 2022-06-21 MED ORDER — FAMOTIDINE IN NACL 20-0.9 MG/50ML-% IV SOLN
20.0000 mg | Freq: Once | INTRAVENOUS | Status: AC
  Administered 2022-06-21: 20 mg via INTRAVENOUS
  Filled 2022-06-21: qty 50

## 2022-06-21 MED ORDER — LACTATED RINGERS IV BOLUS
1000.0000 mL | Freq: Once | INTRAVENOUS | Status: AC
  Administered 2022-06-21: 1000 mL via INTRAVENOUS

## 2022-06-21 MED ORDER — ONDANSETRON HCL 4 MG/2ML IJ SOLN
4.0000 mg | Freq: Once | INTRAMUSCULAR | Status: AC
  Administered 2022-06-21: 4 mg via INTRAVENOUS
  Filled 2022-06-21: qty 2

## 2022-06-21 NOTE — Discharge Instructions (Signed)

## 2022-06-21 NOTE — MAU Provider Note (Addendum)
History     CSN: [id number]  Arrival date and time: 06/20/22 2324   Event Date/Time   First Provider Initiated Contact with Patient 06/21/22 0018      Chief Complaint  Patient presents with   Nausea   Emesis   Ms. Renee Barnett is a 26 y.o. G3P2002 at [redacted]w[redacted]d who presents to MAU for nausea and vomiting. Patient reports this started yesterday. Pt reports vomiting 4 times on Saturday and 3 times today. Patient states she tried eating saltines today, but states she throws up 10-15 minutes after eating or drinking anything. Patient tested positive for COVID 06/11/2022. Patient states her symptoms included flu-like symptoms, with only one episode of vomiting. Pt states she did eat out on Friday, but does not recall what she ate. Patient states she tried taking Unisom at home, but vomited it right back up. Patient states she also tried to take Tylenol for a headache yesterday but vomited immediately after taking it. Patient states she currently has a HA that she rates as 5/10 and reports a history of migraines, but states this feels less painful than her typical migraines. Eating and drinking makes N/V worse, sleeping helps. Patient's mother present for entire visit. Patient has not had an [place] this pregnancy but is sure of LMP and reports regular, monthly periods.  Pt denies VB, LOF, ctx, vaginal discharge/odor/itching. Pt denies abdominal pain, constipation, diarrhea, or urinary problems. Pt denies fever, chills, fatigue, sweating or changes in appetite. Pt denies SOB or chest pain. Pt denies dizziness, light-headedness, weakness.  Problems this pregnancy include: migraines, recent COVID. Allergies? NKDA Current medications/supplements? none Prenatal care provider? [facility], next appt 06/28/2022    OB History     Gravida  3   Para  2   Term  2   Preterm  0   AB  0   Living  2      SAB  0   IAB  0   Ectopic  0   Multiple  0   Live Births  2           Past Medical  History:  Diagnosis Date   GBS (group B Streptococcus carrier), +RV culture, currently pregnant 10/27/2016   GERD (gastroesophageal reflux disease)    Medical history non-contributory    Migraines    Preterm premature rupture of membranes (PPROM) with unknown onset of labor 10/29/2016    Past Surgical History:  Procedure Laterality Date   NO PAST SURGERIES      Family History  Problem Relation Age of Onset   Hypertension Mother    Diabetes Maternal Grandmother    Graves' disease Maternal Aunt     Social History   Tobacco Use   Smoking status: Never   Smokeless tobacco: Never  Vaping Use   Vaping Use: Never used  Substance Use Topics   Alcohol use: Yes    Alcohol/week: 3.0 standard drinks of alcohol    Types: 3 Standard drinks or equivalent per week    Comment: last use 02/02/21   Drug use: Never    Allergies: No Known Allergies  Medications Prior to Admission  Medication Sig Dispense Refill Last Dose   Prenatal 28-0.8 MG TABS Take 1 tablet by mouth daily. 30 tablet 12    promethazine (PHENERGAN) 25 MG tablet Take 1 tablet (25 mg total) by mouth every 6 (six) hours as needed for nausea or vomiting. 30 tablet 1     Review of Systems  Constitutional:  Negative for  chills, diaphoresis, fatigue and fever.  Eyes:  Negative for visual disturbance.  Respiratory:  Negative for shortness of breath.   Cardiovascular:  Negative for chest pain.  Gastrointestinal:  Positive for nausea and vomiting. Negative for abdominal pain, constipation and diarrhea.  Genitourinary:  Negative for dysuria, flank pain, frequency, pelvic pain, urgency, vaginal bleeding and vaginal discharge.  Neurological:  Positive for headaches. Negative for dizziness, weakness and light-headedness.   Physical Exam   Blood pressure 129/77, pulse 93, temperature 98.6 F (37 C), temperature source Oral, resp. rate 16, height 5\' 4"  (1.626 m), weight 85.8 kg, last menstrual period 04/13/2022, SpO2 100  %.  Patient Vitals for the past 24 hrs:  BP Temp Temp src Pulse Resp SpO2 Height Weight  06/20/22 2343 129/77 98.6 F (37 C) Oral 93 16 100 % 5\' 4"  (1.626 m) 85.8 kg   Physical Exam Vitals and nursing note reviewed.  Constitutional:      General: She is not in acute distress.    Appearance: Normal appearance. She is not ill-appearing, toxic-appearing or diaphoretic.  HENT:     Head: Normocephalic and atraumatic.  Pulmonary:     Effort: Pulmonary effort is normal.  Neurological:     General: No focal deficit present.     Mental Status: She is alert and oriented to person, place, and time.  Psychiatric:        Mood and Affect: Mood normal.        Behavior: Behavior normal.        Thought Content: Thought content normal.        Judgment: Judgment normal.    Results for orders placed or performed during the hospital encounter of 06/20/22 (from the past 24 hour(s))  Urinalysis, Routine w reflex microscopic Urine, Clean Catch     Status: Abnormal   Collection Time: 06/21/22 12:15 AM  Result Value Ref Range   Color, Urine AMBER (A) YELLOW   APPearance HAZY (A) CLEAR   Specific Gravity, Urine 1.027 1.005 - 1.030   pH 5.0 5.0 - 8.0   Glucose, UA NEGATIVE NEGATIVE mg/dL   Hgb urine dipstick NEGATIVE NEGATIVE   Bilirubin Urine NEGATIVE NEGATIVE   Ketones, ur 80 (A) NEGATIVE mg/dL   Protein, ur 30 (A) NEGATIVE mg/dL   Nitrite NEGATIVE NEGATIVE   Leukocytes,Ua NEGATIVE NEGATIVE   RBC / HPF 0-5 0 - 5 RBC/hpf   WBC, UA 6-10 0 - 5 WBC/hpf   Bacteria, UA NONE SEEN NONE SEEN   Squamous Epithelial / LPF 11-20 0 - 5   Mucus PRESENT     No results found.  MAU Course  Procedures  MDM -N/V + 5/10 HA -UA: amber/hazy/80ketones/30PRO -1L LR + 20mg  Pepcid +4mg  Zofran given -CBC, CMP ordered -report given to R. 06/22/22, CNM 06/23/22, NP  12:38 AM 06/21/2022  Reassessment @ 0230: patient tolerated apple juice  Orders Placed This Encounter  Procedures   Urinalysis, Routine  w reflex microscopic Urine, Clean Catch    Standing Status:   Standing    Number of Occurrences:   1   CBC with Differential/Platelet    Standing Status:   Standing    Number of Occurrences:   1   Comprehensive metabolic panel    Standing Status:   Standing    Number of Occurrences:   1   Insert peripheral IV    Standing Status:   Standing    Number of Occurrences:   1   Meds ordered this encounter  Medications   lactated ringers bolus 1,000 mL   ondansetron (ZOFRAN) injection 4 mg   famotidine (PEPCID) IVPB 20 mg premix   Assessment and Plan  Morning sickness  - Continue with Phenergan previously prescribed at home - Rx: Zofran 4 mg ODT prn Phenergan not effective - Information provided on morning sickness   [redacted] weeks gestation of pregnancy   - Discharge patient - Keep scheduled appt with [facility] on 07/06/2022 - Patient verbalized an understanding of the plan of care and agrees.   Raelyn Mora, CNM  06/21/2022 2:45 AM

## 2022-06-28 ENCOUNTER — Ambulatory Visit (INDEPENDENT_AMBULATORY_CARE_PROVIDER_SITE_OTHER): Payer: Medicaid Other

## 2022-06-28 VITALS — BP 130/76 | HR 76 | Ht 64.0 in | Wt 188.0 lb

## 2022-06-28 DIAGNOSIS — O3680X0 Pregnancy with inconclusive fetal viability, not applicable or unspecified: Secondary | ICD-10-CM

## 2022-06-28 DIAGNOSIS — Z3480 Encounter for supervision of other normal pregnancy, unspecified trimester: Secondary | ICD-10-CM

## 2022-06-28 MED ORDER — BLOOD PRESSURE KIT DEVI: 1.0000 | 0 refills | Status: DC

## 2022-06-28 NOTE — Progress Notes (Signed)
New OB Intake  I connected with Renee Barnett on 06/28/22 at  3:10 PM EST by in person and verified that I am speaking with the correct person using two identifiers. Nurse is located at [facility] and pt is located at [place].  I discussed the limitations, risks, security and privacy concerns of performing an evaluation and management service by telephone and the availability of in person appointments. I also discussed with the patient that there may be a patient responsible charge related to this service. The patient expressed understanding and agreed to proceed.  I explained I am completing New OB Intake today. We discussed EDD of 01/18/23 that is based on LMP of 04/13/22. Pt is G3/P2002. I reviewed her allergies, medications, Medical/Surgical/OB history, and appropriate screenings. I informed her of [facility] services. [facility] information placed in AVS. Based on history, this is a low risk pregnancy.  Patient Active Problem List   Diagnosis Date Noted   Overweight BMI=28.2 02/12/2021   Herpes 12/30/2017    Concerns addressed today  Delivery Plans Plans to deliver at [facility]. Patient given information for [facility] Healthy Baby website for more information about [facility]. Patient is interested in water birth. Offered upcoming OB visit with CNM to discuss further.  MyChart/Babyscripts MyChart access verified. I explained pt will have some visits in office and some virtually. Babyscripts instructions given and order placed. Patient verifies receipt of registration text/e-mail. Account successfully created and app downloaded.  Blood Pressure Cuff/Weight Scale Blood pressure cuff ordered for patient to pick-up from [employer]. Explained after first prenatal appt pt will check weekly and document in Babyscripts. Patient does have weight scale.  Anatomy US Explained first scheduled [place] will be around 19 weeks. Dating and viability u/s performed today. Anatomy US  TBD.  Labs Discussed [name] genetic screening with patient. Would like both Panorama and Horizon drawn at new OB visit. Routine prenatal labs needed.  COVID Vaccine Patient has had COVID vaccine.   Is patient a CenteringPregnancy candidate?  Declined Declined due to Group setting Not a candidate due to  If accepted,   Social Determinants of Health Food Insecurity: Patient denies food insecurity. WIC Referral: Patient is interested in referral to [facility].  Transportation: Patient denies transportation needs. Childcare: Discussed no children allowed at ultrasound appointments. Offered childcare services; patient declines childcare services at this time.  First visit review I reviewed new OB appt with patient. I explained they will have a provider visit that includes prenatal labs, std screening, pap smear, genetic screening, and discuss plan of care for pregnancy. Explained pt will be seen by Coral Ceo at first visit; encounter routed to appropriate provider. Explained that patient will be seen by pregnancy navigator following visit with provider.   Hamilton Capri, RN 06/28/2022  3:25 PM

## 2022-07-05 ENCOUNTER — Encounter: Payer: Medicaid Other | Admitting: Obstetrics and Gynecology

## 2022-07-06 ENCOUNTER — Encounter: Payer: Self-pay | Admitting: Obstetrics

## 2022-07-06 ENCOUNTER — Other Ambulatory Visit (HOSPITAL_COMMUNITY)
Admission: RE | Admit: 2022-07-06 | Discharge: 2022-07-06 | Disposition: A | Discharge status: Home / Self Care | Payer: Medicaid Other | Source: Ambulatory Visit | Attending: Obstetrics and Gynecology | Admitting: Obstetrics and Gynecology

## 2022-07-06 ENCOUNTER — Ambulatory Visit (INDEPENDENT_AMBULATORY_CARE_PROVIDER_SITE_OTHER): Payer: Medicaid Other | Admitting: Obstetrics

## 2022-07-06 VITALS — BP 131/73 | HR 81 | Wt 186.0 lb

## 2022-07-06 DIAGNOSIS — Z1332 Encounter for screening for maternal depression: Secondary | ICD-10-CM | POA: Diagnosis not present

## 2022-07-06 DIAGNOSIS — Z3481 Encounter for supervision of other normal pregnancy, first trimester: Secondary | ICD-10-CM | POA: Diagnosis not present

## 2022-07-06 DIAGNOSIS — O219 Vomiting of pregnancy, unspecified: Secondary | ICD-10-CM | POA: Diagnosis not present

## 2022-07-06 DIAGNOSIS — K5901 Slow transit constipation: Secondary | ICD-10-CM

## 2022-07-06 DIAGNOSIS — Z3A12 12 weeks gestation of pregnancy: Secondary | ICD-10-CM

## 2022-07-06 DIAGNOSIS — Z3480 Encounter for supervision of other normal pregnancy, unspecified trimester: Secondary | ICD-10-CM | POA: Diagnosis present

## 2022-07-06 DIAGNOSIS — K219 Gastro-esophageal reflux disease without esophagitis: Secondary | ICD-10-CM | POA: Diagnosis not present

## 2022-07-06 MED ORDER — PANTOPRAZOLE SODIUM 20 MG PO TBEC: 20.0000 mg | DELAYED_RELEASE_TABLET | Freq: Two times a day (BID) | ORAL | 5 refills | Status: DC

## 2022-07-06 MED ORDER — DOCUSATE SODIUM 100 MG PO CAPS: 100.0000 mg | ORAL_CAPSULE | Freq: Two times a day (BID) | ORAL | 11 refills | Status: DC

## 2022-07-06 MED ORDER — POLYETHYLENE GLYCOL 3350 17 G PO PACK: 17.0000 g | PACK | Freq: Every day | ORAL | 6 refills | Status: DC

## 2022-07-06 MED ORDER — DOXYLAMINE-PYRIDOXINE 10-10 MG PO TBEC: DELAYED_RELEASE_TABLET | ORAL | 5 refills | Status: DC

## 2022-07-06 NOTE — Progress Notes (Signed)
Subjective:    Renee Barnett is being seen today for her first obstetrical visit.  This is a planned pregnancy. She is at [redacted]w[redacted]d gestation. Her obstetrical history is significant for  none . Relationship with FOB: significant other, living together. Patient does intend to breast feed. Pregnancy history fully reviewed.  The information documented in the HPI was reviewed and verified.  Menstrual History: OB History     Gravida  3   Para  2   Term  2   Preterm  0   AB  0   Living  2      SAB  0   IAB  0   Ectopic  0   Multiple  0   Live Births  2            Patient's last menstrual period was 04/13/2022 (exact date).    Past Medical History:  Diagnosis Date   GBS (group B Streptococcus carrier), +RV culture, currently pregnant 10/27/2016   GERD (gastroesophageal reflux disease)    Medical history non-contributory    Migraines    Preterm premature rupture of membranes (PPROM) with unknown onset of labor 10/29/2016    Past Surgical History:  Procedure Laterality Date   NO PAST SURGERIES     WISDOM TOOTH EXTRACTION      (Not in a hospital admission)  No Known Allergies  Social History   Tobacco Use   Smoking status: Never   Smokeless tobacco: Never  Substance Use Topics   Alcohol use: Yes    Alcohol/week: 3.0 standard drinks of alcohol    Types: 3 Standard drinks or equivalent per week    Comment: last use 02/02/21    Family History  Problem Relation Age of Onset   Hypertension Mother    Migraines Mother    Diabetes Maternal Grandmother    Graves' disease Maternal Aunt      Review of Systems Constitutional: negative for weight loss Gastrointestinal: negative for vomiting Genitourinary:negative for genital lesions and vaginal discharge and dysuria Musculoskeletal:negative for back pain Behavioral/Psych: negative for abusive relationship, depression, illegal drug usage and tobacco use    Objective:    BP 131/73   Pulse 81   Wt 186 lb (84.4  kg)   LMP 04/13/2022 (Exact Date)   BMI 31.93 kg/m  General Appearance:    Alert, cooperative, no distress, appears stated age  Head:    Normocephalic, without obvious abnormality, atraumatic  Eyes:    PERRL, conjunctiva/corneas clear, EOM's intact, fundi    benign, both eyes  Ears:    Normal TM's and external ear canals, both ears  Nose:   Nares normal, septum midline, mucosa normal, no drainage    or sinus tenderness  Throat:   Lips, mucosa, and tongue normal; teeth and gums normal  Neck:   Supple, symmetrical, trachea midline, no adenopathy;    thyroid:  no enlargement/tenderness/nodules; no carotid   bruit or JVD  Back:     Symmetric, no curvature, ROM normal, no CVA tenderness  Lungs:     Clear to auscultation bilaterally, respirations unlabored  Chest Wall:    No tenderness or deformity   Heart:    Regular rate and rhythm, S1 and S2 normal, no murmur, rub   or gallop  Breast Exam:    No tenderness, masses, or nipple abnormality  Abdomen:     Soft, non-tender, bowel sounds active all four quadrants,    no masses, no organomegaly  Genitalia:    Normal  female without lesion, discharge or tenderness  Extremities:   Extremities normal, atraumatic, no cyanosis or edema  Pulses:   2+ and symmetric all extremities  Skin:   Skin color, texture, turgor normal, no rashes or lesions  Lymph nodes:   Cervical, supraclavicular, and axillary nodes normal  Neurologic:   CNII-XII intact, normal strength, sensation and reflexes    throughout      Lab Review Urine pregnancy test Labs reviewed yes Radiologic studies reviewed no  Assessment:    Pregnancy at [redacted]w[redacted]d weeks    Plan:   1. Supervision of other normal pregnancy, antepartum [Z34.80] Rx: - PANORAMA PRENATAL TEST FULL PANEL - HORIZON CUSTOM - CBC/D/Plt+RPR+Rh+ABO+RubIgG... - Culture, OB Urine - Cervicovaginal ancillary only( [facility]) - Cytology - PAP( [facility]) - HgB A1c - [place] MFM OB DETAIL +14 WK; Future  2. Slow  transit constipation X: - polyethylene glycol (MIRALAX) 17 g packet; Take 17 g by mouth at bedtime.  Dispense: 30 each; Refill: 6  3. Nausea and vomiting in pregnancy X: - Doxylamine-Pyridoxine (DICLEGIS) 10-10 MG TBEC; 1 tab in AM, 1 tab mid afternoon 2 tabs at bedtime. Max dose 4 tabs daily.  Dispense: 100 tablet; Refill: 5  4. Gastroesophageal reflux disease without esophagitis X: - pantoprazole (PROTONIX) 20 MG tablet; Take 1 tablet (20 mg total) by mouth 2 (two) times daily before a meal.  Dispense: 60 tablet; Refill: 5   5. Depression - counseling recommended    Patient counseled regarding medication and/or counseling. Declines counseling now, agrees to try medication. Zoloft 100 mg daily prescribed, will monitor for side effects. HI/SI precautions advised; she was told to come back here or go to nearest ER for any worsening symptoms.  Will re-evaluate at next visit.     Prenatal vitamins.  Counseling provided regarding continued use of seat belts, cessation of alcohol consumption, smoking or use of illicit drugs; infection precautions i.e., influenza/TDAP immunizations, toxoplasmosis,CMV, parvovirus, listeria and varicella; workplace safety, exercise during pregnancy; routine dental care, safe medications, sexual activity, hot tubs, saunas, pools, travel, caffeine use, fish and methlymercury, potential toxins, hair treatments, varicose veins Weight gain recommendations per IOM guidelines reviewed: underweight/BMI< 18.5--> gain 28 - 40 lbs; normal weight/BMI 18.5 - 24.9--> gain 25 - 35 lbs; overweight/BMI 25 - 29.9--> gain 15 - 25 lbs; obese/BMI >30->gain  11 - 20 lbs Problem list reviewed and updated. FIRST/CF mutation testing/NIPT/QUAD SCREEN/fragile X/Ashkenazi Jewish population testing/Spinal muscular atrophy discussed: requested. Role of ultrasound in pregnancy discussed; fetal survey: requested. Amniocentesis discussed: not indicated.  No orders of the defined types were placed  in this encounter.  Orders Placed This Encounter  Procedures   Culture, OB Urine   [place] MFM OB DETAIL +14 WK    Standing Status:   Future    Standing Expiration Date:   07/06/2023    Order Specific Question:   Reason for Exam (SYMPTOM  OR DIAGNOSIS REQUIRED)    Answer:   natomy    Order Specific Question:   Preferred Location    Answer:   [facility] Ultrasound   [facility] PRENATAL TEST FULL PANEL    ==========Department Information========== ID: [id number] Department:[facility] [address] [place] [address] Dept: [phone] Dept Fax: [phone]     Order Specific Question:   Expected due date (MM/DD/YYYY):    Answer:   01/18/2023    Order  Specific Question:   Is this a twin pregnancy? (viable, no vanished twin)    Answer:   No    Order Specific Question:   Is this a surrogate or egg donor pregnancy?    Answer:   No    Order Specific Question:   I want fetal sex included in the report:    Answer:   Yes    Order Specific Question:   Maternal Weight (lbs):    Answer:   38    Order Specific Question:   Which Microdeletion Panel should be ordered?    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    Answer:   Furniture conservator/restorer Question:   By placing this electronic order I confirm the testing ordered herein is medically necessary and this patient has been informed of the details of the genetic test(s) ordered, including the risks, benefits, and alternatives, and has consented to testing.    Answer:   Yes    Order Specific Question:   Select an order diagnosis: For additional options refer to [email or web address].[email or web address]    Answer:   Encounter for supervision of other normal pregnancy in first trimester [1509661]   HORIZON CUSTOM    ==========Department Information========== ID: [id number] Department:[facility] [address] [place] [address] Dept: [phone] Dept Fax: [phone]     Order Specific Question:   Specify the name or ID of a valid Horizon Custom Panel:    Answer:   HBASIC    Order Specific Question:   Is patient pregnant?    Answer:   Yes    Order Specific Question:   Ethnicity of patient:    Answer:   African American    Order Specific Question:   Practice ensures that HIPAA consent is obtained and will make available to [facility] upon request?    Answer:   Yes    Order Specific Question:   By placing this electronic order I confirm the testing ordered herein is medically necessary and this patient has been informed of the details of the genetic test(s) ordered, including the risks, benefits, and alternatives, and has consented to testing.    Answer:   Yes    Order Specific Question:   What type of billing?    Answer:   [employer] Specific Question:   Select an order diagnosis: For additional options refer to [email or web address].[email or web address]    Answer:   Encounter for supervision of other normal pregnancy, first trimester [3016010]    Order Specific Question:   Tay-Sachs add-on test?    Answer:   No   CBC/D/Plt+RPR+Rh+ABO+RubIgG...   HgB A1c    Follow up in 4 weeks.  I have spent a total of 25 minutes of face-to-face time, excluding clinical staff time, reviewing notes and preparing to see patient, ordering tests and/or medications, and counseling the patient.   Brock Bad, MD 07/06/2022 4:05 PM

## 2022-07-07 LAB — HEMOGLOBIN A1C
Est. average glucose Bld gHb Est-mCnc: 97 mg/dL
Hgb A1c MFr Bld: 5 % (ref 4.8–5.6)

## 2022-07-07 LAB — CBC/D/PLT+RPR+RH+ABO+RUBIGG...
Antibody Screen: NEGATIVE
Basophils Absolute: 0 10*3/uL (ref 0.0–0.2)
Basos: 0 %
EOS (ABSOLUTE): 0.1 10*3/uL (ref 0.0–0.4)
Eos: 1 %
HCV Ab: NONREACTIVE
HIV Screen 4th Generation wRfx: NONREACTIVE
Hematocrit: 34.3 % (ref 34.0–46.6)
Hemoglobin: 11.9 g/dL (ref 11.1–15.9)
Hepatitis B Surface Ag: NEGATIVE
Immature Grans (Abs): 0 10*3/uL (ref 0.0–0.1)
Immature Granulocytes: 0 %
Lymphocytes Absolute: 1.8 10*3/uL (ref 0.7–3.1)
Lymphs: 26 %
MCH: 29 pg (ref 26.6–33.0)
MCHC: 34.7 g/dL (ref 31.5–35.7)
MCV: 84 fL (ref 79–97)
Monocytes Absolute: 0.4 10*3/uL (ref 0.1–0.9)
Monocytes: 6 %
Neutrophils Absolute: 4.3 10*3/uL (ref 1.4–7.0)
Neutrophils: 67 %
Platelets: 226 10*3/uL (ref 150–450)
RBC: 4.11 x10E6/uL (ref 3.77–5.28)
RDW: 13 % (ref 11.7–15.4)
RPR Ser Ql: NONREACTIVE
Rh Factor: POSITIVE
Rubella Antibodies, IGG: 1.57 index (ref >0.99)
WBC: 6.6 10*3/uL (ref 3.4–10.8)

## 2022-07-07 LAB — HCV INTERPRETATION

## 2022-07-08 LAB — CULTURE, OB URINE

## 2022-07-08 LAB — URINE CULTURE, OB REFLEX

## 2022-07-09 LAB — CYTOLOGY - PAP
Comment: NEGATIVE
Diagnosis: NEGATIVE
High risk HPV: NEGATIVE

## 2022-07-09 LAB — CERVICOVAGINAL ANCILLARY ONLY
Bacterial Vaginitis (gardnerella): POSITIVE — AB
Candida Glabrata: NEGATIVE
Candida Vaginitis: NEGATIVE
Chlamydia: NEGATIVE
Comment: NEGATIVE
Comment: NEGATIVE
Comment: NEGATIVE
Comment: NEGATIVE
Comment: NEGATIVE
Comment: NORMAL
Neisseria Gonorrhea: NEGATIVE
Trichomonas: NEGATIVE

## 2022-07-11 ENCOUNTER — Other Ambulatory Visit: Payer: Self-pay | Admitting: Obstetrics

## 2022-07-11 DIAGNOSIS — B9689 Other specified bacterial agents as the cause of diseases classified elsewhere: Secondary | ICD-10-CM

## 2022-07-11 MED ORDER — METRONIDAZOLE 500 MG PO TABS: 500.0000 mg | ORAL_TABLET | Freq: Two times a day (BID) | ORAL | 2 refills | Status: DC

## 2022-07-12 ENCOUNTER — Encounter: Payer: Self-pay | Admitting: Obstetrics

## 2022-07-12 NOTE — Progress Notes (Signed)
Advised of BV and RX sent. Pharmacy confirmed. Pt verbalized understanding.

## 2022-07-13 LAB — PANORAMA PRENATAL TEST FULL PANEL:PANORAMA TEST PLUS 5 ADDITIONAL MICRODELETIONS: FETAL FRACTION: 7

## 2022-07-14 LAB — HORIZON CUSTOM: REPORT SUMMARY: NEGATIVE

## 2022-07-21 ENCOUNTER — Encounter: Payer: Self-pay | Admitting: Obstetrics

## 2022-08-03 ENCOUNTER — Encounter: Payer: Medicaid Other | Admitting: Obstetrics & Gynecology

## 2022-08-10 ENCOUNTER — Encounter: Payer: Medicaid Other | Admitting: Obstetrics

## 2022-08-11 ENCOUNTER — Encounter: Payer: Self-pay | Admitting: Obstetrics

## 2022-08-11 ENCOUNTER — Ambulatory Visit (INDEPENDENT_AMBULATORY_CARE_PROVIDER_SITE_OTHER): Payer: Medicaid Other | Admitting: Obstetrics

## 2022-08-11 VITALS — BP 113/66 | HR 97 | Wt 184.0 lb

## 2022-08-11 DIAGNOSIS — Z3480 Encounter for supervision of other normal pregnancy, unspecified trimester: Secondary | ICD-10-CM

## 2022-08-11 DIAGNOSIS — O219 Vomiting of pregnancy, unspecified: Secondary | ICD-10-CM

## 2022-08-11 DIAGNOSIS — K219 Gastro-esophageal reflux disease without esophagitis: Secondary | ICD-10-CM

## 2022-08-11 DIAGNOSIS — Z3A17 17 weeks gestation of pregnancy: Secondary | ICD-10-CM

## 2022-08-11 DIAGNOSIS — Z3482 Encounter for supervision of other normal pregnancy, second trimester: Secondary | ICD-10-CM

## 2022-08-11 NOTE — Progress Notes (Signed)
Subjective:  Renee Barnett is a 27 y.o. G3P2002 at [redacted]w[redacted]d being seen today for ongoing prenatal care.  She is currently monitored for the following issues for this low-risk pregnancy and has Herpes; Overweight BMI=28.2; and Supervision of other normal pregnancy, antepartum on their problem list.  Patient reports nausea.  Contractions: Not present. Vag. Bleeding: None.   . Denies leaking of fluid.   The following portions of the patient's history were reviewed and updated as appropriate: allergies, current medications, past family history, past medical history, past social history, past surgical history and problem list. Problem list updated.  Objective:   Vitals:   08/11/22 1447  BP: 113/66  Pulse: 97  Weight: 184 lb (83.5 kg)    Fetal Status: Fetal Heart Rate (bpm): 154         General:  Alert, oriented and cooperative. Patient is in no acute distress.  Skin: Skin is warm and dry. No rash noted.   Cardiovascular: Normal heart rate noted  Respiratory: Normal respiratory effort, no problems with respiration noted  Abdomen: Soft, gravid, appropriate for gestational age. Pain/Pressure: Present     Pelvic:  Cervical exam deferred        Extremities: Normal range of motion.  Edema: Trace  Mental Status: Normal mood and affect. Normal behavior. Normal judgment and thought content.   Urinalysis:      Assessment and Plan:  Pregnancy: G3P2002 at [redacted]w[redacted]d  1. Supervision of other normal pregnancy, antepartum R x: - AFP, Serum, Open Spina Bifida  2. Nausea and vomiting in pregnancy - much improved with meds  3. Gastroesophageal reflux disease without esophagitis - much improved with meds   Preterm labor symptoms and general obstetric precautions including but not limited to vaginal bleeding, contractions, leaking of fluid and fetal movement were reviewed in detail with the patient. Please refer to After Visit Summary for other counseling recommendations.   Return in about 4 weeks  (around 09/08/2022) for ROB.   Shelly Bombard, MD 08/11/2022

## 2022-08-13 ENCOUNTER — Encounter: Payer: Medicaid Other | Admitting: Obstetrics

## 2022-08-13 LAB — AFP, SERUM, OPEN SPINA BIFIDA
AFP MoM: 1.38
AFP Value: 52.3 ng/mL
Gest. Age on Collection Date: 17.1 weeks
Maternal Age At EDD: 25.2 yr
OSBR Risk 1 IN: 7620
Test Results:: NEGATIVE
Weight: 184 [lb_av]

## 2022-08-24 ENCOUNTER — Ambulatory Visit: Payer: Medicaid Other

## 2022-08-24 ENCOUNTER — Ambulatory Visit: Payer: Medicaid Other | Attending: Maternal & Fetal Medicine

## 2022-08-24 VITALS — BP 122/60 | HR 90

## 2022-08-24 DIAGNOSIS — Z3480 Encounter for supervision of other normal pregnancy, unspecified trimester: Secondary | ICD-10-CM

## 2022-08-24 DIAGNOSIS — O09292 Supervision of pregnancy with other poor reproductive or obstetric history, second trimester: Secondary | ICD-10-CM | POA: Insufficient documentation

## 2022-08-24 DIAGNOSIS — O321XX0 Maternal care for breech presentation, not applicable or unspecified: Secondary | ICD-10-CM | POA: Diagnosis not present

## 2022-08-24 DIAGNOSIS — Z363 Encounter for antenatal screening for malformations: Secondary | ICD-10-CM | POA: Insufficient documentation

## 2022-08-24 DIAGNOSIS — O99212 Obesity complicating pregnancy, second trimester: Secondary | ICD-10-CM | POA: Diagnosis not present

## 2022-08-24 DIAGNOSIS — Z3A19 19 weeks gestation of pregnancy: Secondary | ICD-10-CM | POA: Insufficient documentation

## 2022-08-25 ENCOUNTER — Other Ambulatory Visit: Payer: Self-pay | Admitting: *Deleted

## 2022-08-25 DIAGNOSIS — Z8759 Personal history of other complications of pregnancy, childbirth and the puerperium: Secondary | ICD-10-CM

## 2022-09-08 ENCOUNTER — Ambulatory Visit (INDEPENDENT_AMBULATORY_CARE_PROVIDER_SITE_OTHER): Payer: Medicaid Other

## 2022-09-08 VITALS — BP 120/71 | HR 78 | Wt 185.6 lb

## 2022-09-08 DIAGNOSIS — Z3482 Encounter for supervision of other normal pregnancy, second trimester: Secondary | ICD-10-CM

## 2022-09-08 DIAGNOSIS — Z3A22 22 weeks gestation of pregnancy: Secondary | ICD-10-CM

## 2022-09-08 DIAGNOSIS — Z3480 Encounter for supervision of other normal pregnancy, unspecified trimester: Secondary | ICD-10-CM

## 2022-09-08 NOTE — Progress Notes (Signed)
   LOW-RISK PREGNANCY OFFICE VISIT  Patient name: Renee Barnett MRN [id number]  Date of birth: 29-Jul-1996 Chief Complaint:   Routine Prenatal Visit  Subjective:   Renee Barnett is a 27 y.o. G95P2002 female at [redacted]w[redacted]d with an Estimated Date of Delivery: 01/12/23 being seen today for ongoing management of a low-risk pregnancy aeb has Herpes; Overweight BMI=28.2; and Supervision of other normal pregnancy, antepartum on their problem list.  Patient presents today, alone, with no complaints.  Patient endorses fetal movement. Patient endorses some abdominal cramping, but denies contractions.  She states the cramping is relieved with rest.  She reports drinking water throughout the day. Patient denies vaginal concerns including abnormal discharge, leaking of fluid, and bleeding. No issues with urination or diarrhea. She reports some intermittent constipation, but is not taking medication as prescribed.    Contractions: Not present. Vag. Bleeding: None.  Movement: Present.  Reviewed past medical,surgical, social, obstetrical and family history as well as problem list, medications and allergies.  Objective   Vitals:   09/08/22 1447  BP: 120/71  Pulse: 78  Weight: 185 lb 9.6 oz (84.2 kg)  Body mass index is 31.86 kg/m.  Total Weight Gain:-11 lb 6.4 oz (-5.171 kg)         Physical Examination:   General appearance: Well appearing, and in no distress  Mental status: Alert, oriented to person, place, and time  Skin: Warm & dry  Cardiovascular: Normal heart rate noted  Respiratory: Normal respiratory effort, no distress  Abdomen: Soft, gravid, nontender, AGA with Fundal Height: 22 cm  Pelvic: Cervical exam deferred           Extremities: Edema: None  Fetal Status: Fetal Heart Rate (bpm): 151  Movement: Present   No results found for this or any previous visit (from the past 24 hour(s)).  Assessment & Plan:  Low-risk pregnancy of a 27 y.o., G3P2002 at [redacted]w[redacted]d with an Estimated Date of  Delivery: 01/12/23   1. Supervision of other normal pregnancy, antepartum -Anticipatory guidance for upcoming appts. -Patient to schedule next appt in 5 weeks for an in-person visit. -Reviewed Glucola appt preparation including fasting the night before and morning of.   *Informed that it is okay to drink plain water throughout the night and prior to consumption of Glucola formula.  -Discussed anticipated office time of 2.5-3 hours.  -Reviewed blood draw procedures and labs which also include check of iron/HgB level, RPR, and HIV *Informed that repeat RPR/HIV are for pediatric records/compliance.  -Discussed how results of GTT are handled including diabetic education and BS testing for abnormal results and routine care for normal results.    2. [redacted] weeks gestation of pregnancy -Doing well. -Encouraged increased fiber in diet for intermittent constipation. -Further encouraged increased water intake.  -Reviewed weight loss of 11lbs.  Discussed eating high protein snacks.      Meds: No orders of the defined types were placed in this encounter.  Labs/procedures today:  Lab Orders  No laboratory test(s) ordered today     Reviewed: Preterm labor symptoms and general obstetric precautions including but not limited to vaginal bleeding, contractions, leaking of fluid and fetal movement were reviewed in detail with the patient.  All questions were answered.  Follow-up: Return in about 5 weeks (around 10/13/2022) for [place] with GTT.  No orders of the defined types were placed in this encounter.  Maryann Conners MSN, CNM 09/08/2022

## 2022-09-08 NOTE — Progress Notes (Signed)
Pt presents for ROB visit. No concerns today.  

## 2022-09-21 ENCOUNTER — Other Ambulatory Visit: Payer: Self-pay

## 2022-09-21 ENCOUNTER — Encounter: Payer: Self-pay | Admitting: Obstetrics

## 2022-09-21 ENCOUNTER — Ambulatory Visit: Payer: Medicaid Other | Attending: Obstetrics

## 2022-09-21 ENCOUNTER — Ambulatory Visit: Payer: Medicaid Other

## 2022-09-21 VITALS — BP 127/64 | HR 80

## 2022-09-21 DIAGNOSIS — Z8759 Personal history of other complications of pregnancy, childbirth and the puerperium: Secondary | ICD-10-CM

## 2022-09-21 DIAGNOSIS — Z3480 Encounter for supervision of other normal pregnancy, unspecified trimester: Secondary | ICD-10-CM | POA: Insufficient documentation

## 2022-09-21 DIAGNOSIS — O99212 Obesity complicating pregnancy, second trimester: Secondary | ICD-10-CM | POA: Insufficient documentation

## 2022-09-21 DIAGNOSIS — O321XX0 Maternal care for breech presentation, not applicable or unspecified: Secondary | ICD-10-CM | POA: Insufficient documentation

## 2022-09-21 DIAGNOSIS — E669 Obesity, unspecified: Secondary | ICD-10-CM

## 2022-09-21 DIAGNOSIS — O09292 Supervision of pregnancy with other poor reproductive or obstetric history, second trimester: Secondary | ICD-10-CM

## 2022-09-21 DIAGNOSIS — Z3A23 23 weeks gestation of pregnancy: Secondary | ICD-10-CM | POA: Insufficient documentation

## 2022-09-21 DIAGNOSIS — Z363 Encounter for antenatal screening for malformations: Secondary | ICD-10-CM | POA: Insufficient documentation

## 2022-09-22 ENCOUNTER — Encounter: Payer: Self-pay | Admitting: Emergency Medicine

## 2022-09-27 ENCOUNTER — Encounter: Payer: Self-pay | Admitting: Obstetrics

## 2022-10-13 ENCOUNTER — Ambulatory Visit (INDEPENDENT_AMBULATORY_CARE_PROVIDER_SITE_OTHER): Payer: Medicaid Other | Admitting: Obstetrics & Gynecology

## 2022-10-13 ENCOUNTER — Other Ambulatory Visit: Payer: Medicaid Other

## 2022-10-13 VITALS — BP 115/77 | HR 89 | Wt 189.0 lb

## 2022-10-13 DIAGNOSIS — Z3482 Encounter for supervision of other normal pregnancy, second trimester: Secondary | ICD-10-CM | POA: Diagnosis not present

## 2022-10-13 DIAGNOSIS — Z3A27 27 weeks gestation of pregnancy: Secondary | ICD-10-CM

## 2022-10-13 DIAGNOSIS — Z1339 Encounter for screening examination for other mental health and behavioral disorders: Secondary | ICD-10-CM | POA: Diagnosis not present

## 2022-10-13 DIAGNOSIS — Z3480 Encounter for supervision of other normal pregnancy, unspecified trimester: Secondary | ICD-10-CM

## 2022-10-13 NOTE — Progress Notes (Signed)
   PRENATAL VISIT NOTE  Subjective:  Renee Barnett is a 27 y.o. G3P2002 at [redacted]w[redacted]d being seen today for ongoing prenatal care.  She is currently monitored for the following issues for this low-risk pregnancy and has Herpes; Overweight BMI=28.2; and Supervision of other normal pregnancy, antepartum on their problem list.  Patient reports no complaints.  Contractions: Not present. Vag. Bleeding: None.  Movement: Present. Denies leaking of fluid.   The following portions of the patient's history were reviewed and updated as appropriate: allergies, current medications, past family history, past medical history, past social history, past surgical history and problem list.   Objective:   Vitals:   10/13/22 0821  BP: 115/77  Pulse: 89  Weight: 85.7 kg    Fetal Status: Fetal Heart Rate (bpm): 144   Movement: Present     General:  Alert, oriented and cooperative. Patient is in no acute distress.  Skin: Skin is warm and dry. No rash noted.   Cardiovascular: Normal heart rate noted  Respiratory: Normal respiratory effort, no problems with respiration noted  Abdomen: Soft, gravid, appropriate for gestational age.  Pain/Pressure: Present     Pelvic: Cervical exam deferred        Extremities: Normal range of motion.  Edema: None  Mental Status: Normal mood and affect. Normal behavior. Normal judgment and thought content.   Assessment and Plan:  Pregnancy: G3P2002 at [redacted]w[redacted]d 1. Supervision of other normal pregnancy, antepartum Followed by MFM for fetal growth o/w doing well  Preterm labor symptoms and general obstetric precautions including but not limited to vaginal bleeding, contractions, leaking of fluid and fetal movement were reviewed in detail with the patient. Please refer to After Visit Summary for other counseling recommendations.  Orders Placed This Encounter  Procedures   Glucose Tolerance, 2 Hours w/1 Hour   RPR   CBC   HIV antibody (with reflex)    Return in about 4 weeks  (around 11/10/2022).  Future Appointments  Date Time Provider [place]  10/26/2022  1:15 PM [facility] NURSE [facility]  10/26/2022  1:30 PM [facility] US2 [facility]    Emeterio Reeve, MD

## 2022-10-13 NOTE — Progress Notes (Signed)
ROB/GTT.  Declined TDAp.

## 2022-10-14 LAB — GLUCOSE TOLERANCE, 2 HOURS W/ 1HR
Glucose, 1 hour: 146 mg/dL (ref 70–179)
Glucose, 2 hour: 103 mg/dL (ref 70–152)
Glucose, Fasting: 70 mg/dL (ref 70–91)

## 2022-10-14 LAB — HIV ANTIBODY (ROUTINE TESTING W REFLEX): HIV Screen 4th Generation wRfx: NONREACTIVE

## 2022-10-14 LAB — CBC
Hematocrit: 34.7 % (ref 34.0–46.6)
Hemoglobin: 11.6 g/dL (ref 11.1–15.9)
MCH: 29.5 pg (ref 26.6–33.0)
MCHC: 33.4 g/dL (ref 31.5–35.7)
MCV: 88 fL (ref 79–97)
Platelets: 151 10*3/uL (ref 150–450)
RBC: 3.93 x10E6/uL (ref 3.77–5.28)
RDW: 13 % (ref 11.7–15.4)
WBC: 7.7 10*3/uL (ref 3.4–10.8)

## 2022-10-14 LAB — RPR: RPR Ser Ql: NONREACTIVE

## 2022-10-18 ENCOUNTER — Ambulatory Visit (INDEPENDENT_AMBULATORY_CARE_PROVIDER_SITE_OTHER): Payer: Medicaid Other | Admitting: Obstetrics & Gynecology

## 2022-10-18 ENCOUNTER — Inpatient Hospital Stay (HOSPITAL_COMMUNITY)
Admission: AD | Admit: 2022-10-18 | Discharge: 2022-10-18 | Disposition: A | Discharge status: Home / Self Care | Payer: Medicaid Other | Attending: Family Medicine | Admitting: Family Medicine

## 2022-10-18 ENCOUNTER — Encounter (HOSPITAL_COMMUNITY): Payer: Self-pay | Admitting: Family Medicine

## 2022-10-18 VITALS — BP 116/71 | HR 75 | Wt 190.0 lb

## 2022-10-18 DIAGNOSIS — W19XXXA Unspecified fall, initial encounter: Secondary | ICD-10-CM | POA: Diagnosis not present

## 2022-10-18 DIAGNOSIS — Z3A27 27 weeks gestation of pregnancy: Secondary | ICD-10-CM | POA: Diagnosis not present

## 2022-10-18 DIAGNOSIS — O26892 Other specified pregnancy related conditions, second trimester: Secondary | ICD-10-CM | POA: Diagnosis not present

## 2022-10-18 DIAGNOSIS — Z3482 Encounter for supervision of other normal pregnancy, second trimester: Secondary | ICD-10-CM | POA: Diagnosis not present

## 2022-10-18 DIAGNOSIS — W109XXA Fall (on) (from) unspecified stairs and steps, initial encounter: Secondary | ICD-10-CM | POA: Insufficient documentation

## 2022-10-18 DIAGNOSIS — O99891 Other specified diseases and conditions complicating pregnancy: Secondary | ICD-10-CM

## 2022-10-18 DIAGNOSIS — M549 Dorsalgia, unspecified: Secondary | ICD-10-CM | POA: Insufficient documentation

## 2022-10-18 DIAGNOSIS — Z3480 Encounter for supervision of other normal pregnancy, unspecified trimester: Secondary | ICD-10-CM

## 2022-10-18 NOTE — MAU Note (Signed)
Renee Barnett is a 26 y.o. at [redacted]w[redacted]d here in MAU reporting: slipped down the steps yesterday at her moms.  Hit low back.  Felt the baby move a few times after fall. Today he is not moving as much and feeling at knot in her lower abd. So she went to the office and they sent her over. (Dr Roselie Awkward had called, sending for extended monitoring).  Having pain in her lower. Stomach is just uncomfortable from the knot, not really pain.  Onset of complaint: yesterday Pain score: 6 when touched Vitals:   10/18/22 1216  BP: 121/64  Pulse: 82  Resp: 18  Temp: 98.5 F (36.9 C)  SpO2: 100%     FHT:148 Lab orders placed from triage:

## 2022-10-18 NOTE — MAU Provider Note (Signed)
History     CSN: [id number]  Arrival date and time: 10/18/22 1203   Event Date/Time   First Provider Initiated Contact with Patient 10/18/22 1253      Chief Complaint  Patient presents with   Fall   Decreased Fetal Movement   Back Pain   27 y.o. [id number] @27 .5 wks presenting after fall yesterday. Reports slipping down 2-3 wooden stairs while wearing flip flops. No syncope. Reports hitting her lower back and right shoulder blade. Rates pain 6/10. Has not taken anything for it. No abdominal contact. Denies abd pain, LOF, or VB. Reports occasional tightening of abdomen which she was also having prior to fall. Reports less FM since last night,    OB History     Gravida  3   Para  2   Term  2   Preterm  0   AB  0   Living  2      SAB  0   IAB  0   Ectopic  0   Multiple  0   Live Births  2           Past Medical History:  Diagnosis Date   GBS (group B Streptococcus carrier), +RV culture, currently pregnant 10/27/2016   GERD (gastroesophageal reflux disease)    Migraines    Preterm premature rupture of membranes (PPROM) with unknown onset of labor 10/29/2016    Past Surgical History:  Procedure Laterality Date   NO PAST SURGERIES     WISDOM TOOTH EXTRACTION      Family History  Problem Relation Age of Onset   Hypertension Mother    Migraines Mother    Berenice Primas' disease Maternal Aunt    Diabetes Maternal Grandmother    Asthma Neg Hx    Heart disease Neg Hx    Stroke Neg Hx     Social History   Tobacco Use   Smoking status: Never   Smokeless tobacco: Never  Vaping Use   Vaping Use: Never used  Substance Use Topics   Alcohol use: Not Currently    Alcohol/week: 3.0 standard drinks of alcohol    Types: 3 Standard drinks or equivalent per week    Comment: last use 02/02/21   Drug use: Never    Allergies: No Known Allergies  Medications Prior to Admission  Medication Sig Dispense Refill Last Dose   docusate sodium (COLACE) 100 MG capsule  Take 1 capsule (100 mg total) by mouth 2 (two) times daily. 60 capsule 11 10/18/2022   pantoprazole (PROTONIX) 20 MG tablet Take 1 tablet (20 mg total) by mouth 2 (two) times daily before a meal. 60 tablet 5 10/18/2022   Prenatal 28-0.8 MG TABS Take 1 tablet by mouth daily. 30 tablet 12 10/18/2022   Blood Pressure Monitoring (BLOOD PRESSURE KIT) DEVI 1 kit by Does not apply route once a week. (Patient not taking: Reported on 09/21/2022) 1 each 0    Doxylamine-Pyridoxine (DICLEGIS) 10-10 MG TBEC 1 tab in AM, 1 tab mid afternoon 2 tabs at bedtime. Max dose 4 tabs daily. 100 tablet 5    metroNIDAZOLE (FLAGYL) 500 MG tablet Take 1 tablet (500 mg total) by mouth 2 (two) times daily. (Patient not taking: Reported on 08/24/2022) 14 tablet 2    ondansetron (ZOFRAN-ODT) 4 MG disintegrating tablet Take 1 tablet (4 mg total) by mouth every 8 (eight) hours as needed for nausea or vomiting. (Patient not taking: Reported on 06/28/2022) 90 tablet 0    polyethylene glycol (MIRALAX) 17  g packet Take 17 g by mouth at bedtime. (Patient not taking: Reported on 08/24/2022) 30 each 6    promethazine (PHENERGAN) 25 MG tablet Take 1 tablet (25 mg total) by mouth every 6 (six) hours as needed for nausea or vomiting. (Patient not taking: Reported on 08/24/2022) 30 tablet 1     Review of Systems  Gastrointestinal:  Negative for abdominal pain.  Genitourinary:  Negative for vaginal bleeding and vaginal discharge.  Musculoskeletal:  Positive for back pain.   Physical Exam   Blood pressure 121/64, pulse 82, temperature 98.5 F (36.9 C), temperature source Oral, resp. rate 18, height 5\' 4"  (1.626 m), weight 86.2 kg, last menstrual period 04/13/2022, SpO2 100 %.  Physical Exam Vitals and nursing note reviewed.  Constitutional:      Appearance: Normal appearance.  HENT:     Head: Normocephalic and atraumatic.  Cardiovascular:     Rate and Rhythm: Normal rate.  Pulmonary:     Effort: Pulmonary effort is normal. No respiratory  distress.  Abdominal:     Palpations: Abdomen is soft.     Tenderness: There is no abdominal tenderness.  Musculoskeletal:        General: Normal range of motion.     Cervical back: Normal and normal range of motion.     Thoracic back: Normal.     Lumbar back: Tenderness (central) present.  Skin:    General: Skin is warm and dry.  Neurological:     General: No focal deficit present.     Mental Status: She is alert and oriented to person, place, and time.  Psychiatric:        Mood and Affect: Mood normal.        Behavior: Behavior normal.   EFM: 145 bpm, mod variability, + accels, rare variable decels Toco: UI  No results found for this or any previous visit (from the past 24 hour(s)).  MAU Course  Procedures Prolonged EFM  MDM No signs of PTL or abruption. Pt feeling excellent FM since EFM applied. NST reactive. Pt reassured. Stable for discharge home.   Assessment and Plan   1. [redacted] weeks gestation of pregnancy   2. Fall, initial encounter   3. Back pain affecting pregnancy in second trimester    Discharge home Follow up at [facility] as scheduled PTL/abruption precautions  Allergies as of 10/18/2022   No Known Allergies      Medication List     STOP taking these medications    docusate sodium 100 MG capsule Commonly known as: Colace   metroNIDAZOLE 500 MG tablet Commonly known as: FLAGYL   ondansetron 4 MG disintegrating tablet Commonly known as: ZOFRAN-ODT   polyethylene glycol 17 g packet Commonly known as: MiraLax   promethazine 25 MG tablet Commonly known as: PHENERGAN       TAKE these medications    Blood Pressure Kit Devi 1 kit by Does not apply route once a week.   Doxylamine-Pyridoxine 10-10 MG Tbec Commonly known as: Diclegis 1 tab in AM, 1 tab mid afternoon 2 tabs at bedtime. Max dose 4 tabs daily.   pantoprazole 20 MG tablet Commonly known as: Protonix Take 1 tablet (20 mg total) by mouth 2 (two) times daily before a meal.    Prenatal 28-0.8 MG Tabs Take 1 tablet by mouth daily.        Julianne Handler, CNM 10/18/2022, 1:04 PM

## 2022-10-18 NOTE — Progress Notes (Unsigned)
   PRENATAL VISIT NOTE  Subjective:  Renee Barnett is a 27 y.o. G3P2002 at [redacted]w[redacted]d being seen today for ongoing prenatal care.  She is currently monitored for the following issues for this low-risk pregnancy and has Herpes; Overweight BMI=28.2; and Supervision of other normal pregnancy, antepartum on their problem list.  Patient reports occasional contractions and decreased fetal movement after falling down stairs at home yesterday, some pain noted .  Contractions: Not present. Vag. Bleeding: None.  Movement: Present. Denies leaking of fluid.   The following portions of the patient's history were reviewed and updated as appropriate: allergies, current medications, past family history, past medical history, past social history, past surgical history and problem list.   Objective:   Vitals:   10/18/22 1131  BP: 116/71  Pulse: 75  Weight: 86.2 kg    Fetal Status: Fetal Heart Rate (bpm): 147   Movement: Present     General:  Alert, oriented and cooperative. Patient is in no acute distress.  Skin: Skin is warm and dry. No rash noted.   Cardiovascular: Normal heart rate noted  Respiratory: Normal respiratory effort, no problems with respiration noted  Abdomen: Soft, gravid, appropriate for gestational age.  Pain/Pressure: Present     Pelvic: Cervical exam deferred        Extremities: Normal range of motion.  Edema: None  Mental Status: Normal mood and affect. Normal behavior. Normal judgment and thought content.   Assessment and Plan:  Pregnancy: G3P2002 at [redacted]w[redacted]d 1. Supervision of other normal pregnancy, antepartum Needs appropriate monitoring after a fall with decreased fetal movement, instructed to go to MAU  Preterm labor symptoms and general obstetric precautions including but not limited to vaginal bleeding, contractions, leaking of fluid and fetal movement were reviewed in detail with the patient. Please refer to After Visit Summary for other counseling recommendations.   Return  in about 3 weeks (around 11/08/2022).  Future Appointments  Date Time Provider [place]  10/26/2022  1:15 PM [facility] NURSE [facility]  10/26/2022  1:30 PM [facility]  11/10/2022  2:30 PM Shelly Bombard, MD [facility] None    Emeterio Reeve, MD

## 2022-10-18 NOTE — Progress Notes (Signed)
ROB, c/o falling down the stairs yesterday, Pt did not seek medical attention, stated that there is a knot in her abdomin and decreased movement.Marland Kitchen

## 2022-10-20 ENCOUNTER — Telehealth: Payer: Self-pay | Admitting: Emergency Medicine

## 2022-10-20 ENCOUNTER — Encounter: Payer: Self-pay | Admitting: Obstetrics

## 2022-10-20 NOTE — Telephone Encounter (Signed)
TC to patient to inform of results.  

## 2022-10-26 ENCOUNTER — Ambulatory Visit: Payer: Medicaid Other | Attending: Maternal & Fetal Medicine

## 2022-10-26 ENCOUNTER — Ambulatory Visit: Payer: Medicaid Other | Admitting: *Deleted

## 2022-10-26 ENCOUNTER — Other Ambulatory Visit: Payer: Self-pay | Admitting: *Deleted

## 2022-10-26 ENCOUNTER — Encounter: Payer: Self-pay | Admitting: *Deleted

## 2022-10-26 VITALS — BP 119/65 | HR 79

## 2022-10-26 DIAGNOSIS — Z363 Encounter for antenatal screening for malformations: Secondary | ICD-10-CM | POA: Insufficient documentation

## 2022-10-26 DIAGNOSIS — Z3480 Encounter for supervision of other normal pregnancy, unspecified trimester: Secondary | ICD-10-CM | POA: Diagnosis present

## 2022-10-26 DIAGNOSIS — O09292 Supervision of pregnancy with other poor reproductive or obstetric history, second trimester: Secondary | ICD-10-CM | POA: Diagnosis not present

## 2022-10-26 DIAGNOSIS — O99213 Obesity complicating pregnancy, third trimester: Secondary | ICD-10-CM | POA: Diagnosis not present

## 2022-10-26 DIAGNOSIS — O09293 Supervision of pregnancy with other poor reproductive or obstetric history, third trimester: Secondary | ICD-10-CM | POA: Insufficient documentation

## 2022-10-26 DIAGNOSIS — O99212 Obesity complicating pregnancy, second trimester: Secondary | ICD-10-CM

## 2022-10-26 DIAGNOSIS — E669 Obesity, unspecified: Secondary | ICD-10-CM

## 2022-10-26 DIAGNOSIS — Z3A28 28 weeks gestation of pregnancy: Secondary | ICD-10-CM | POA: Diagnosis not present

## 2022-10-26 DIAGNOSIS — Z8759 Personal history of other complications of pregnancy, childbirth and the puerperium: Secondary | ICD-10-CM

## 2022-11-10 ENCOUNTER — Encounter: Payer: Medicaid Other | Admitting: Obstetrics

## 2022-11-25 ENCOUNTER — Ambulatory Visit: Payer: Medicaid Other

## 2022-11-27 ENCOUNTER — Ambulatory Visit: Payer: Self-pay

## 2022-11-29 ENCOUNTER — Ambulatory Visit (INDEPENDENT_AMBULATORY_CARE_PROVIDER_SITE_OTHER): Payer: Medicaid Other | Admitting: Obstetrics and Gynecology

## 2022-11-29 ENCOUNTER — Encounter: Payer: Self-pay | Admitting: Obstetrics and Gynecology

## 2022-11-29 DIAGNOSIS — R300 Dysuria: Secondary | ICD-10-CM | POA: Insufficient documentation

## 2022-11-29 LAB — POCT URINALYSIS DIPSTICK
Bilirubin, UA: NEGATIVE
Blood, UA: POSITIVE
Glucose, UA: NEGATIVE
Nitrite, UA: NEGATIVE
Protein, UA: NEGATIVE
Spec Grav, UA: 1.01 (ref 1.010–1.025)
Urobilinogen, UA: 0.2 E.U./dL
pH, UA: 5 (ref 5.0–8.0)

## 2022-11-29 NOTE — Progress Notes (Signed)
Ms Renee Barnett presents for incision check and c/o dysuria. Pt ia @ 3 weeks from LTCS. She had noted some increased urination and dysuria for the last few days. Denies fever or chills Tolerating diet  PE AF VSS Lungs clear  Heart RRR Abd soft, + BS,  incision healing well, small suture knot noted. ( Pt declined removal in office today)  A/P Incision check        Dysuria  Pt reassured in regards to her c section incision. Will check urine culture. Referral to behavior health. F/U with PP visit.

## 2022-11-29 NOTE — Progress Notes (Addendum)
27 y.o 3 weeks PP, CS delivery presents for Incision Check, Dysuria.  C/o burning, painful urination 5/10 x 3 days and urinary frequency. Denies fever, chills, NV.  Pt is breastfeeding.  EPDS=25

## 2022-12-01 LAB — URINE CULTURE

## 2022-12-02 ENCOUNTER — Encounter: Payer: Self-pay | Admitting: Obstetrics

## 2022-12-03 ENCOUNTER — Ambulatory Visit: Payer: Self-pay

## 2022-12-08 ENCOUNTER — Ambulatory Visit
Admission: EM | Admit: 2022-12-08 | Discharge: 2022-12-08 | Disposition: A | Discharge status: Home / Self Care | Payer: Medicaid Other | Attending: Internal Medicine | Admitting: Internal Medicine

## 2022-12-08 DIAGNOSIS — H1013 Acute atopic conjunctivitis, bilateral: Secondary | ICD-10-CM | POA: Diagnosis not present

## 2022-12-08 MED ORDER — KETOTIFEN FUMARATE 0.035 % OP SOLN: 1.0000 [drp] | Freq: Two times a day (BID) | OPHTHALMIC | 0 refills | Status: DC

## 2022-12-08 NOTE — ED Triage Notes (Signed)
Pt c/o two days both eyes are irritated, dry, and burning.  No drainage, F or chills.

## 2022-12-08 NOTE — Discharge Instructions (Addendum)
You have been prescribed Ketotifen for your eyes. This is use to treat inflammation in your eyes. Take the drops as directed. If no improvement, recommend you follow up with an ophthalmologist as soon as possible.  Our office is limited in the tests and imaging we can perform at our facility.  Therefore, it is very important for you to pay attention to any new symptoms or worsening of your current condition.   Please go directly to the Emergency Department immediately should you begin to feel worse in any way or have any of the following symptoms: increased pain, increased discharge, vision changes, loss of vision, nausea/vomiting or headache.

## 2022-12-08 NOTE — ED Provider Notes (Signed)
[facility]  Note:  This document was prepared using Dragon voice recognition software and may include unintentional dictation errors.  MRN: [id number] DOB: 1996/02/11 DATE: 12/08/22   Subjective:  Chief Complaint:  Chief Complaint  Patient presents with   Eye Problem    HPI: Renee Barnett is a 27 y.o. female presenting for her dry, pruritic eyes bilaterally for 2 days.  Patient states yesterday she started noticing that her eyes were pruritic and irritated.  She describes them as dry and burning bilaterally.  The right eye is worse than the left.  She reports no discharge from either eye.  She does not wear contacts or glasses. Denies fever, congestion, runny nose, cough, sore throat. Endorses eye pruritus. Presents NAD.  Prior to Admission medications   Medication Sig Start Date End Date Taking? Authorizing Provider  Prenatal 28-0.8 MG TABS Take 1 tablet by mouth daily. 05/10/22   Constant, Peggy, MD  ipratropium (ATROVENT) 0.06 % nasal spray Place 2 sprays into both nostrils 4 (four) times daily. Patient not taking: Reported on 03/10/2020 12/24/19 05/06/20  Belinda Fisher, PA-C     No Known Allergies  History:   Past Medical History:  Diagnosis Date   GBS (group B Streptococcus carrier), +RV culture, currently pregnant 10/27/2016   GERD (gastroesophageal reflux disease)    Herpes 12/30/2017   Diagnosed 2018   Migraines    Preterm premature rupture of membranes (PPROM) with unknown onset of labor 10/29/2016     Past Surgical History:  Procedure Laterality Date   NO PAST SURGERIES     WISDOM TOOTH EXTRACTION      Family History  Problem Relation Age of Onset   Hypertension Mother    Migraines Mother    Luiz Blare' disease Maternal Aunt    Diabetes Maternal Grandmother    Asthma Neg Hx    Heart disease Neg Hx    Stroke Neg Hx     Social History   Tobacco Use   Smoking status: Never   Smokeless tobacco: Never  Vaping Use   Vaping Use: Never used  Substance Use  Topics   Alcohol use: Not Currently    Alcohol/week: 3.0 standard drinks of alcohol    Types: 3 Standard drinks or equivalent per week    Comment: last use 02/02/21   Drug use: Never    Review of Systems  Constitutional:  Negative for fever.  HENT:  Negative for congestion, ear pain, rhinorrhea and sore throat.   Eyes:  Positive for itching. Negative for pain, discharge and redness.  Respiratory:  Negative for cough.   Gastrointestinal:  Negative for nausea and vomiting.     Objective:   Vitals: BP 137/86 (BP Location: Right Arm)   Pulse 66   Temp 98.6 F (37 C) (Oral)   Resp 18   SpO2 98%   Breastfeeding Yes   Physical Exam Constitutional:      General: She is not in acute distress.    Appearance: Normal appearance. She is well-developed and overweight. She is not ill-appearing or toxic-appearing.  HENT:     Head: Normocephalic and atraumatic.     Right Ear: Tympanic membrane and ear canal normal.     Left Ear: Tympanic membrane and ear canal normal.     Mouth/Throat:     Pharynx: Oropharynx is clear. Uvula midline. No pharyngeal swelling, oropharyngeal exudate or posterior oropharyngeal erythema.     Tonsils: No tonsillar exudate or tonsillar abscesses.  Eyes:  General: Lids are normal.        Right eye: No discharge.        Left eye: No discharge.     Extraocular Movements: Extraocular movements intact.     Conjunctiva/sclera: Conjunctivae normal.     Pupils: Pupils are equal, round, and reactive to light.  Cardiovascular:     Rate and Rhythm: Normal rate and regular rhythm.     Heart sounds: Normal heart sounds.  Pulmonary:     Effort: Pulmonary effort is normal.     Breath sounds: Normal breath sounds.     Comments: Clear to auscultation bilaterally  Abdominal:     General: Bowel sounds are normal.     Palpations: Abdomen is soft.     Tenderness: There is no abdominal tenderness.  Skin:    General: Skin is warm and dry.  Neurological:     General: No  focal deficit present.     Mental Status: She is alert.  Psychiatric:        Mood and Affect: Mood and affect normal.     Results:  Labs: No results found for this or any previous visit (from the past 24 hour(s)).  Radiology: No results found.   UC Course/Treatments:  Procedures: Procedures   Medications Ordered in UC: Medications - No data to display   Assessment and Plan :     ICD-10-CM   1. Allergic conjunctivitis of both eyes  H10.13      Allergic conjunctivitis of both eyes: Afebrile, nontoxic-appearing, NAD. VSS. DDX includes but not limited to: Allergic conjunctivitis, bacterial conjunctivitis, viral conjunctivitis PE was unremarkable today in office.  Given pruritus with no discharge, suspect allergic conjunctivitis.  Ketotifen 2 times a day was prescribed to help with inflammation.  If no improvement, recommend follow-up with ophthalmologist and/or PCP.  Strict ED precautions were given and patient verbalized understanding.   ED Discharge Orders          Ordered    ketotifen (ZADITOR) 0.035 % ophthalmic solution  2 times daily        12/08/22 1939             PDMP not reviewed this encounter.     Cynda Acres, PA-C 12/08/22 1952

## 2022-12-13 ENCOUNTER — Ambulatory Visit (INDEPENDENT_AMBULATORY_CARE_PROVIDER_SITE_OTHER): Payer: Medicaid Other | Admitting: Licensed Clinical Social Worker

## 2022-12-13 DIAGNOSIS — F4321 Adjustment disorder with depressed mood: Secondary | ICD-10-CM | POA: Diagnosis not present

## 2022-12-14 ENCOUNTER — Telehealth: Payer: Self-pay

## 2022-12-14 NOTE — Telephone Encounter (Signed)
Attempted to contact patient to discuss FMLA paperwork. No answer. Left vm for patient to return call to office.

## 2022-12-14 NOTE — BH Specialist Note (Addendum)
Integrated Behavioral Health via Telemedicine Visit  12/14/2022 Renee Barnett [id number]  Number of Integrated Behavioral Health Clinician visits: 1 Session Start time:  10:02am Session End time: 10:36am Total time in minutes: 34 mins via phone and mychart. Pt located at NICU at [facility] and loss connection remaining visit completed via phone lost connection approx 50% into visit   Referring Provider: Dr Clearance Coots  Patient/Family location: NICU at [facility] Provider location: [facility]  All persons participating in visit: Pt Renee Barnett and LCSW  Renee Barnett  Types of Service: Individual psychotherapy  I connected with Renee Barnett and/or Renee Barnett's n/Renee via  Telephone or Engineer, civil (consulting)  (Video is Caregility application) and verified that I am speaking with the correct person using two identifiers. Discussed confidentiality: Yes   I discussed the limitations of telemedicine and the availability of in person appointments.  Discussed there is Renee possibility of technology failure and discussed alternative modes of communication if that failure occurs.  I discussed that engaging in this telemedicine visit, they consent to the provision of behavioral healthcare and the services will be billed under their insurance.  Patient and/or legal guardian expressed understanding and consented to Telemedicine visit: Yes   Presenting Concerns: Patient and/or family reports the following symptoms/concerns:  Duration of problem: approx 3 weeks ; Severity of problem: mild  Patient and/or Family's Strengths/Protective Factors: Concrete supports in place (healthy food, safe environments, etc.)  Goals Addressed: Patient will:  Reduce symptoms of: adjustment disorder and stress    Increase knowledge and/or ability of: coping skills and stress reduction   Demonstrate ability to: Increase adequate support systems for patient/family  Progress towards  Goals: Ongoing  Interventions: Interventions utilized:  Supportive Counseling Standardized Assessments completed: Not Needed  Patient and/or Family Response: Renee Barnett reports stress and depressed mood due to newborn currently in NICU at [facility]. Renee Barnett reports two older children and constantly at NICU causes added stress.   Assessment: Patient currently experiencing adjustment disorder with depressed mood.   Patient may benefit from integrated behavioral health.  Plan: Follow up with behavioral health clinician on : 2  weeks  Behavioral recommendations: communicate need for added support, relaxation technique, and self care Referral(s): Integrated [employer] (In Clinic)  I discussed the assessment and treatment plan with the patient and/or parent/guardian. They were provided an opportunity to ask questions and all were answered. They agreed with the plan and demonstrated an understanding of the instructions.   They were advised to call back or seek an in-person evaluation if the symptoms worsen or if the condition fails to improve as anticipated.  Renee Saxon, LCSW

## 2022-12-23 ENCOUNTER — Telehealth (INDEPENDENT_AMBULATORY_CARE_PROVIDER_SITE_OTHER): Payer: Medicaid Other | Admitting: Obstetrics & Gynecology

## 2022-12-23 NOTE — Progress Notes (Signed)
Provider location: [facility] for [facility]   Patient location: Home  I connected on 12/23/22 at  8:55 AM EDT by Mychart Video Encounter and verified that I am speaking with the correct person using two identifiers.       I discussed the limitations, risks, security and privacy concerns of performing an evaluation and management service virtually and the availability of in person appointments. I also discussed with the patient that there may be a patient responsible charge related to this service. The patient expressed understanding and agreed to proceed.  Post Partum Visit Note Subjective:   Renee Barnett is a 27 y.o. G57P2002 female who presents for a postpartum visit. She is 6 weeks postpartum following a primary cesarean section.  I have fully reviewed the prenatal and intrapartum course. The delivery was at 31.0 gestational weeks.  Anesthesia: epidural. Postpartum course has been UNREMARKABLE. Baby is doing well. Baby is feeding by breast. Bleeding no bleeding. Bowel function is normal. Bladder function is normal. Patient is not sexually active. Contraception method is none. Postpartum depression screening: positive =20   Upstream - 12/23/22 1610       Pregnancy Intention Screening   Does the patient want to become pregnant in the next year? No    Does the patient's partner want to become pregnant in the next year? No    Would the patient like to discuss contraceptive options today? No      Contraception Wrap Up   Current Method No Contraceptive Precautions            The pregnancy intention screening data noted above was reviewed. Potential methods of contraception were discussed. The patient elected to proceed with No data recorded.   Edinburgh Postnatal Depression Scale - 12/23/22 0819       Edinburgh Postnatal Depression Scale:  In the Past 7 Days   I have been able to laugh and see the funny side of things. 1    I have looked forward with enjoyment to  things. 3    I have blamed myself unnecessarily when things went wrong. 3    I have been anxious or worried for no good reason. 3    I have felt scared or panicky for no good reason. 3    Things have been getting on top of me. 2    I have been so unhappy that I have had difficulty sleeping. 3    I have felt sad or miserable. 1    I have been so unhappy that I have been crying. 1    The thought of harming myself has occurred to me. 0    Edinburgh Postnatal Depression Scale Total 20             The following portions of the patient's history were reviewed and updated as appropriate: allergies, current medications, past family history, past medical history, past social history, past surgical history, and problem list.  Review of Systems Pertinent items are noted in HPI.  Objective:  There were no vitals taken for this visit.    General:  Alert, oriented and cooperative. Patient is in no acute distress.  Respiratory: Normal respiratory effort, no problems with respiration noted  Mental Status: Normal mood and affect. Normal behavior. Normal judgment and thought content.  Rest of physical exam deferred due to type of encounter   Assessment:    normal postpartum exam.  Plan:  Essential components of care per ACOG recommendations:  1.  Mood and well being: Patient with positive depression screening today. Reviewed local resources for support.  - Patient does not use tobacco.  - hx of drug use? No    2. Infant care and feeding:  -Patient currently breastmilk feeding? Yes If breastmilk feeding discussed return to work and pumping. If needed, patient was provided letter for work to allow for every 2-3 hr pumping breaks, and to be granted a private location to express breastmilk and refrigerated area to store breastmilk. Reviewed importance of draining breast regularly to support lactation. -Social determinants of health (SDOH) reviewed in EPIC. No concerns The following needs were  identified- she has paperwork for accommodation at work  3. Sexuality, contraception and birth spacing - Patient does not want a pregnancy in the next year.  Desired family size is 3 children.  - Reviewed forms of contraception in tiered fashion. Patient desired  unsure  today.   - Discussed birth spacing of 18 months  4. Sleep and fatigue -Encouraged family/partner/community support of 4 hrs of uninterrupted sleep to help with mood and fatigue  5. Physical Recovery  - Discussed patients delivery which was at [facility]  - Patient has urinary incontinence? No  - Patient is safe to resume physical and sexual activity  6.  Health Maintenance - Last pap smear done 07/2022 and was normal    7. noChronic Disease - PCP follow up  I provided 12 minutes of face-to-face time during this encounter.    No follow-ups on file.  Future Appointments  Date Time Provider Department Center  12/23/2022  8:55 AM Adam Phenix, MD [facility] None  01/06/2023  3:10 PM Nobie Putnam, Cyndi Lennert, MD [facility] None   Adam Phenix, MD   Center for [facility], [facility]

## 2023-01-05 ENCOUNTER — Encounter: Payer: Self-pay | Admitting: Obstetrics & Gynecology

## 2023-01-06 ENCOUNTER — Ambulatory Visit: Payer: Self-pay | Admitting: Family Medicine

## 2023-01-06 ENCOUNTER — Encounter: Payer: Self-pay | Admitting: *Deleted

## 2023-01-12 ENCOUNTER — Inpatient Hospital Stay (HOSPITAL_COMMUNITY): Admission: AD | Admit: 2023-01-12 | Payer: Medicaid Other | Source: Home / Self Care

## 2023-02-04 ENCOUNTER — Encounter: Payer: Medicaid Other | Admitting: Obstetrics and Gynecology

## 2023-02-14 ENCOUNTER — Telehealth (INDEPENDENT_AMBULATORY_CARE_PROVIDER_SITE_OTHER): Payer: Medicaid Other | Admitting: Obstetrics & Gynecology

## 2023-02-14 DIAGNOSIS — F4321 Adjustment disorder with depressed mood: Secondary | ICD-10-CM | POA: Diagnosis not present

## 2023-02-14 NOTE — Progress Notes (Addendum)
MyChart GYN, Pt wants to talk about her depression and anxiety and to be able to work from home instead.  PHQ-9=21  GAD-7=16

## 2023-02-15 ENCOUNTER — Encounter: Payer: Self-pay | Admitting: Obstetrics & Gynecology

## 2023-02-16 ENCOUNTER — Encounter: Payer: Self-pay | Admitting: Obstetrics & Gynecology

## 2023-02-16 NOTE — Progress Notes (Signed)
   TELEHEALTH GYNECOLOGY VISIT ENCOUNTER NOTE  Provider location: Center for [facility] at [facility]   Patient location: Home  I connected with Renee Barnett on 02/16/23 at 10:15 AM EDT by telephone and verified that I am speaking with the correct person using two identifiers. Patient was unable to do MyChart audiovisual encounter due to technical difficulties, she tried several times.    I discussed the limitations, risks, security and privacy concerns of performing an evaluation and management service by telephone and the availability of in person appointments. I also discussed with the patient that there may be a patient responsible charge related to this service. The patient expressed understanding and agreed to proceed.   History:  Renee Barnett is a 27 y.o. [id number] female being evaluated today for postpartum care after cesarean section at 28 weeks for breech at [facility].11/10/2022 She denies any abnormal vaginal discharge, bleeding, pelvic pain or other concerns.    The baby is in the NICU and she has lots of stress and depression and needs work accommodation [place] the [employer]   Past Medical History:  Diagnosis Date   GBS (group B Streptococcus carrier), +RV culture, currently pregnant 10/27/2016   GERD (gastroesophageal reflux disease)    Herpes 12/30/2017   Diagnosed 2018   Migraines    Preterm premature rupture of membranes (PPROM) with unknown onset of labor 10/29/2016   Past Surgical History:  Procedure Laterality Date   NO PAST SURGERIES     WISDOM TOOTH EXTRACTION     The following portions of the patient's history were reviewed and updated as appropriate: allergies, current medications, past family history, past medical history, past social history, past surgical history and problem list.   Health Maintenance:  Normal pap and negative HRHPV on 07/2022.    Review of Systems:  Pertinent items noted in HPI and remainder of comprehensive ROS otherwise  negative.  Physical Exam:   General:  Alert, oriented and cooperative.   Mental Status: Normal mood and affect perceived. Normal judgment and thought content.  Physical exam deferred due to nature of the encounter  Labs and Imaging No results found for this or any previous visit (from the past 336 hour(s)). No results found.    Assessment and Plan:     1. Adjustment disorder with depressed mood  - Ambulatory referral to Integrated Behavioral Health  2. Postpartum care following cesarean delivery 3 months after delivery Note for work to work at home      I discussed the assessment and treatment plan with the patient. The patient was provided an opportunity to ask questions and all were answered. The patient agreed with the plan and demonstrated an understanding of the instructions.   The patient was advised to call back or seek an in-person evaluation/go to the ED if the symptoms worsen or if the condition fails to improve as anticipated.  I provided 15 minutes of non-face-to-face time during this encounter.   Scheryl Darter, MD Center for [facility], [facility]

## 2023-02-22 ENCOUNTER — Ambulatory Visit: Payer: Medicaid Other | Admitting: Licensed Clinical Social Worker

## 2023-02-22 ENCOUNTER — Telehealth: Payer: Self-pay | Admitting: Licensed Clinical Social Worker

## 2023-02-22 DIAGNOSIS — Z91199 Patient's noncompliance with other medical treatment and regimen due to unspecified reason: Secondary | ICD-10-CM

## 2023-02-22 NOTE — Telephone Encounter (Signed)
Called pt to schedule appt. Left message for call back.

## 2023-02-22 NOTE — Telephone Encounter (Signed)
Called pt to schedule mychart visit. Left message for callback

## 2023-02-28 NOTE — BH Specialist Note (Signed)
No show

## 2023-04-28 ENCOUNTER — Ambulatory Visit: Payer: Medicaid Other

## 2023-05-22 ENCOUNTER — Ambulatory Visit
Admission: RE | Admit: 2023-05-22 | Discharge: 2023-05-22 | Disposition: A | Discharge status: Home / Self Care | Payer: Medicaid Other | Source: Ambulatory Visit | Attending: Internal Medicine | Admitting: Internal Medicine

## 2023-05-22 VITALS — BP 114/78 | HR 112 | Temp 99.5°F | Resp 16

## 2023-05-22 DIAGNOSIS — E860 Dehydration: Secondary | ICD-10-CM

## 2023-05-22 DIAGNOSIS — Z3A12 12 weeks gestation of pregnancy: Secondary | ICD-10-CM | POA: Diagnosis not present

## 2023-05-22 DIAGNOSIS — R112 Nausea with vomiting, unspecified: Secondary | ICD-10-CM

## 2023-05-22 LAB — POCT URINE PREGNANCY: Preg Test, Ur: POSITIVE — AB

## 2023-05-22 LAB — POCT URINALYSIS DIP (MANUAL ENTRY)
Blood, UA: NEGATIVE
Glucose, UA: NEGATIVE mg/dL
Nitrite, UA: NEGATIVE
Protein Ur, POC: 30 mg/dL — AB
Spec Grav, UA: >=1.03 — AB (ref 1.010–1.025)
Urobilinogen, UA: 4 U/dL — AB
pH, UA: 6 (ref 5.0–8.0)

## 2023-05-22 MED ORDER — PROMETHAZINE HCL 25 MG PO TABS: 25.0000 mg | ORAL_TABLET | Freq: Four times a day (QID) | ORAL | 0 refills | Status: DC | PRN

## 2023-05-22 MED ORDER — ONDANSETRON HCL 4 MG/2ML IJ SOLN
4.0000 mg | Freq: Once | INTRAMUSCULAR | Status: AC
  Administered 2023-05-22: 4 mg via INTRAVENOUS

## 2023-05-22 MED ORDER — SODIUM CHLORIDE 0.9 % IV BOLUS
1000.0000 mL | Freq: Once | INTRAVENOUS | Status: AC
  Administered 2023-05-22: 1000 mL via INTRAVENOUS

## 2023-05-22 NOTE — ED Triage Notes (Signed)
Pt c/o Unable to keep food or liquid down over the weekend. Feeling L/N. Abd is sore. Denies: F/chills/body aches LMP 7/29, at home pregnancy test positive.

## 2023-05-22 NOTE — ED Provider Notes (Addendum)
[facility]  Note:  This document was prepared using Dragon voice recognition software and may include unintentional dictation errors.  MRN: [id number] DOB: [birth year] DATE: 05/22/23   Subjective:  Chief Complaint:  Chief Complaint  Patient presents with   Possible Pregnancy    Can't keep anything down dehydrated - Entered by patient   Emesis     HPI: Renee Barnett is a 27 y.o. female presenting for nausea and vomiting for the past 2 days. Patient states she took an at home pregnancy test "awhile ago" and it was positive. She has had not seen OB/GYN because she is unsure if she wants to have anymore children. She states her last period was 02/25/2023 making estimate gestational age at 52 weeks 2 days. She reports ongoing nausea and vomiting with worsening this weekend. She has been taking leftover Zofran and has run out. She reports difficulty keeping down foods and liquids. She reports Zofran helps with nausea, but she vomits when she tries to eat or drink. She is unsure how many times she has vomiting this morning. Denies fever, abdominal pain, vaginal bleeding, dysuria. Endorses nausea, vomiting. Presents NAD.  Prior to Admission medications   Medication Sig Start Date End Date Taking? Authorizing Provider  promethazine (PHENERGAN) 25 MG tablet Take 1 tablet (25 mg total) by mouth every 6 (six) hours as needed for nausea or vomiting. 05/22/23  Yes Damaria Stofko P, PA-C  ketotifen (ZADITOR) 0.035 % ophthalmic solution Place 1 drop into both eyes in the morning and at bedtime. Space doses 8-12 hours apart 12/08/22   Vianca Bracher P, PA-C  Prenatal 28-0.8 MG TABS Take 1 tablet by mouth daily. 05/10/22   Constant, Peggy, MD  ipratropium (ATROVENT) 0.06 % nasal spray Place 2 sprays into both nostrils 4 (four) times daily. Patient not taking: Reported on 03/10/2020 12/24/19 05/06/20  Belinda Fisher, PA-C     No Known Allergies  History:   Past Medical History:  Diagnosis Date   GBS  (group B Streptococcus carrier), +RV culture, currently pregnant 10/27/2016   GERD (gastroesophageal reflux disease)    Herpes 12/30/2017   Diagnosed 2018   Migraines    Preterm premature rupture of membranes (PPROM) with unknown onset of labor 10/29/2016     Past Surgical History:  Procedure Laterality Date   NO PAST SURGERIES     WISDOM TOOTH EXTRACTION      Family History  Problem Relation Age of Onset   Hypertension Mother    Migraines Mother    Luiz Blare' disease Maternal Aunt    Diabetes Maternal Grandmother    Asthma Neg Hx    Heart disease Neg Hx    Stroke Neg Hx     Social History   Tobacco Use   Smoking status: Never   Smokeless tobacco: Never  Vaping Use   Vaping status: Never Used  Substance Use Topics   Alcohol use: Not Currently    Alcohol/week: 3.0 standard drinks of alcohol    Types: 3 Standard drinks or equivalent per week    Comment: last use 02/02/21   Drug use: Never    Review of Systems  Constitutional:  Positive for appetite change and fatigue. Negative for fever.  Gastrointestinal:  Positive for nausea and vomiting. Negative for abdominal pain.  Genitourinary:  Negative for dysuria, vaginal bleeding and vaginal pain.     Objective:   Vitals: BP 114/78 (BP Location: Right Arm)   Pulse (!) 112   Temp 99.5 F (37.5 C) (  Oral)   Resp 16   LMP 02/25/2023 (Approximate)   SpO2 97%   Breastfeeding No   Physical Exam Constitutional:      General: She is not in acute distress.    Appearance: Normal appearance. She is well-developed and normal weight. She is not ill-appearing or toxic-appearing.  HENT:     Head: Normocephalic and atraumatic.  Cardiovascular:     Rate and Rhythm: Regular rhythm. Tachycardia present.     Heart sounds: Normal heart sounds.     Comments: Tachycardia resolved after fluids Pulmonary:     Effort: Pulmonary effort is normal.     Breath sounds: Normal breath sounds.     Comments: Clear to auscultation  bilaterally  Abdominal:     General: Bowel sounds are normal.     Palpations: Abdomen is soft.     Tenderness: There is no abdominal tenderness.  Skin:    General: Skin is warm and dry.  Neurological:     General: No focal deficit present.     Mental Status: She is alert.  Psychiatric:        Mood and Affect: Mood and affect normal.     Results:  Labs: Results for orders placed or performed during the hospital encounter of 05/22/23 (from the past 24 hour(s))  POCT urinalysis dipstick     Status: Abnormal   Collection Time: 05/22/23  9:25 AM  Result Value Ref Range   Color, UA straw (A) yellow   Clarity, UA hazy (A) clear   Glucose, UA negative negative mg/dL   Bilirubin, UA moderate (A) negative   Ketones, POC UA moderate (40) (A) negative mg/dL   Spec Grav, UA >=1.610 (A) 1.010 - 1.025   Blood, UA negative negative   pH, UA 6.0 5.0 - 8.0   Protein Ur, POC =30 (A) negative mg/dL   Urobilinogen, UA 4.0 (A) 0.2 or 1.0 E.U./dL   Nitrite, UA Negative Negative   Leukocytes, UA Small (1+) (A) Negative  POCT urine pregnancy     Status: Abnormal   Collection Time: 05/22/23  9:25 AM  Result Value Ref Range   Preg Test, Ur Positive (A) Negative    Radiology: No results found.   UC Course/Treatments:  Procedures: Procedures   Medications Ordered in UC: Medications  ondansetron (ZOFRAN) injection 4 mg (has no administration in time range)  sodium chloride 0.9 % bolus 1,000 mL (has no administration in time range)     Assessment and Plan :     ICD-10-CM   1. Nausea and vomiting, unspecified vomiting type  R11.2 Comprehensive metabolic panel    Comprehensive metabolic panel    2. [redacted] weeks gestation of pregnancy  Z3A.12     3. Dehydration  E86.0 Comprehensive metabolic panel    Comprehensive metabolic panel     Nausea and vomiting, unspecified vomiting type Afebrile, nontoxic-appearing, NAD. VSS. DDX includes but not limited to: pregnancy, gastroenteritis,  cholecystitis, appendicitis, cystitis UPT was positive today in office. Gestational age estimated at 12 weeks 2 days based on LMP. UA showed showing signs of dehydration. Patient was given of NS due to UA and tachycardia. Zofran 4mg  IV was given for vomiting during exam. CMP was ordered, but unable to collect due to near syncopal episode. Tachycardia resolved after fluids. Phenergan 25mg  every 6 hours PRN  was prescribed. Strict ED precautions were given and patient verbalized understanding.  Dehydration Afebrile, nontoxic-appearing, NAD. VSS. DDX includes but not limited to: pregnancy, gastroenteritis, secondary to nausea/vomiting,  pancreatitis UPT was positive today in office. Gestational age estimated at 12 weeks 2 days based on LMP. UA showed showing signs of dehydration. Patient was given of NS due to UA and tachycardia. Zofran 4mg  IV was given for vomiting during exam.  CMP was ordered, but unable to collect due to near syncopal episode. Phenergan 25mg  every 6 hours PRN  was prescribed. Strict ED precautions were given and patient verbalized understanding.   [redacted] weeks gestation of pregnancy Afebrile, nontoxic-appearing, NAD. VSS. Positive UPT in office. Based on LMP, gestational age is 12 weeks 2 days. Recommend follow up with OB/GYN. Strict ED precautions were given and patient verbalized understanding.  ED Discharge Orders          Ordered    promethazine (PHENERGAN) 25 MG tablet  Every 6 hours PRN        05/22/23 0943             PDMP not reviewed this encounter.     Nastasha Reising P, PA-C 05/22/23 1054    Jadi Deyarmin P, PA-C 05/22/23 1111

## 2023-05-22 NOTE — Discharge Instructions (Signed)
Recommend that you increase oral fluids such as water and Pedialyte. A bland diet is recommend at this time such as the B.R.A.T diet (Broth, Rice, Applesauce, Toast) as tolerated. Phenergan is a nausea medication that has been prescribed to you. If you are unable to tolerate fluids despite taking the medication, then it is recommend you go to the ER for further evaluation.

## 2023-05-22 NOTE — ED Notes (Signed)
IV d/c with cath tip intact. Total 1L NS infused. Tolerated well. Gauze and tape applied. Site clean, dry and intact.

## 2023-08-17 ENCOUNTER — Ambulatory Visit
Admission: EM | Admit: 2023-08-17 | Discharge: 2023-08-17 | Disposition: A | Discharge status: Home / Self Care | Payer: Medicaid Other | Attending: Internal Medicine | Admitting: Internal Medicine

## 2023-08-17 ENCOUNTER — Other Ambulatory Visit: Payer: Self-pay

## 2023-08-17 ENCOUNTER — Encounter: Payer: Self-pay | Admitting: *Deleted

## 2023-08-17 DIAGNOSIS — H66003 Acute suppurative otitis media without spontaneous rupture of ear drum, bilateral: Secondary | ICD-10-CM | POA: Diagnosis not present

## 2023-08-17 DIAGNOSIS — U071 COVID-19: Secondary | ICD-10-CM

## 2023-08-17 LAB — POC COVID19/FLU A&B COMBO
Covid Antigen, POC: POSITIVE — AB
Influenza A Antigen, POC: NEGATIVE
Influenza B Antigen, POC: NEGATIVE

## 2023-08-17 MED ORDER — PROMETHAZINE-DM 6.25-15 MG/5ML PO SYRP: 5.0000 mL | ORAL_SOLUTION | Freq: Four times a day (QID) | ORAL | 0 refills | Status: AC | PRN

## 2023-08-17 MED ORDER — BENZONATATE 200 MG PO CAPS: 200.0000 mg | ORAL_CAPSULE | Freq: Three times a day (TID) | ORAL | 0 refills | Status: DC | PRN

## 2023-08-17 MED ORDER — AMOXICILLIN-POT CLAVULANATE 875-125 MG PO TABS: 1.0000 | ORAL_TABLET | Freq: Two times a day (BID) | ORAL | 0 refills | Status: AC

## 2023-08-17 MED ORDER — PAXLOVID (300/100) 20 X 150 MG & 10 X 100MG PO TBPK: 3.0000 | ORAL_TABLET | Freq: Two times a day (BID) | ORAL | 0 refills | Status: AC

## 2023-08-17 NOTE — Discharge Instructions (Addendum)
 You are positive for COVID.  COVID is a virus. A prescription (Paxlovid ) was sent to your pharmacy to help with the duration of symptoms. Recommend you rest and increase oral fluids. Tylenol /Ibuprofen  as needed for fevers and aches.  I have also sent you 2 prescriptions for cough medicine.  Tessalon  Perles are to be used during the day and the cough syrup is to be used at night.  The cough syrup may cause you to be sleepy so I recommend to take at bedtime when you are not driving or working.  Do not combine the 2 medications.  I have also sent a prescription for Augmentin .  This is an antibiotic used to treat ear infections.  Please take as directed.  It is important for you to pay attention to any new symptoms or worsening of your current condition. Please go directly to the Emergency Department immediately should you have any of the following symptoms: chest pain, shortness of breath or difficulty breathing.

## 2023-08-17 NOTE — ED Triage Notes (Signed)
 C/O left ear pain and cough x 2 days. Unsure if fevers. Has been taking Nyquil and using cough drops.

## 2023-08-17 NOTE — ED Provider Notes (Signed)
 [facility]  Note:  This document was prepared using Dragon voice recognition software and may include unintentional dictation errors.  MRN: [id number] DOB: [birth year] DATE: 08/17/23   Subjective:  Chief Complaint:  Chief Complaint  Patient presents with   Ear Pain   Cough     HPI: Renee Barnett is a 28 y.o. female presenting for otalgia and cough for the past 2 days. Patient states she started with a cough on Monday. Reports productive cough that has lead to post tussive vomiting. She reports rhinorrhea as well. She became concerned when she started with a left sided earache today. She has been taking Nyquil and cough drops with no improvement. No known sick contacts. Denies fever, nausea/vomiting, sore throat. Endorses cough, rhinorrhea, otalgia. Presents NAD.  Prior to Admission medications   Medication Sig Start Date End Date Taking? Authorizing Provider  ipratropium (ATROVENT ) 0.06 % nasal spray Place 2 sprays into both nostrils 4 (four) times daily. Patient not taking: Reported on 03/10/2020 12/24/19 05/06/20  Alfrieda Antes, PA-C     No Known Allergies  History:   Past Medical History:  Diagnosis Date   GBS (group B Streptococcus carrier), +RV culture, currently pregnant 10/27/2016   GERD (gastroesophageal reflux disease)    Herpes 12/30/2017   Diagnosed 2018   Migraines    Preterm premature rupture of membranes (PPROM) with unknown onset of labor 10/29/2016     Past Surgical History:  Procedure Laterality Date   WISDOM TOOTH EXTRACTION      Family History  Problem Relation Age of Onset   Hypertension Mother    Migraines Mother    Murrell Arrant' disease Maternal Aunt    Diabetes Maternal Grandmother    Asthma Neg Hx    Heart disease Neg Hx    Stroke Neg Hx     Social History   Tobacco Use   Smoking status: Never   Smokeless tobacco: Never  Vaping Use   Vaping status: Never Used  Substance Use Topics   Alcohol use: Not Currently   Drug use: Never     Review of Systems  Constitutional:  Positive for fatigue. Negative for fever.  HENT:  Positive for congestion, ear pain, rhinorrhea and sneezing. Negative for sore throat.   Respiratory:  Positive for cough.   Gastrointestinal:  Negative for abdominal pain, nausea and vomiting.  Neurological:  Positive for headaches.     Objective:   Vitals: BP 132/76   Pulse (!) 113   Temp 99.6 F (37.6 C) (Oral)   Resp 18   SpO2 98%   Physical Exam Constitutional:      General: She is not in acute distress.    Appearance: Normal appearance. She is well-developed and normal weight. She is not ill-appearing or toxic-appearing.  HENT:     Head: Normocephalic and atraumatic.     Right Ear: Ear canal normal. A middle ear effusion is present. Tympanic membrane is erythematous and bulging.     Left Ear: Ear canal normal. A middle ear effusion is present. Tympanic membrane is erythematous and bulging.     Ears:     Comments: Bilateral TM are bulging with discolored fluid noted behind them as well as slight erythema.    Mouth/Throat:     Pharynx: Oropharynx is clear. Uvula midline. No pharyngeal swelling, oropharyngeal exudate or posterior oropharyngeal erythema.     Tonsils: No tonsillar exudate or tonsillar abscesses.  Cardiovascular:     Rate and Rhythm: Normal rate and regular rhythm.  Heart sounds: Normal heart sounds.  Pulmonary:     Effort: Pulmonary effort is normal.     Breath sounds: Normal breath sounds.     Comments: Clear to auscultation bilaterally  Abdominal:     General: Bowel sounds are normal.     Palpations: Abdomen is soft.     Tenderness: There is no abdominal tenderness.  Skin:    General: Skin is warm and dry.  Neurological:     General: No focal deficit present.     Mental Status: She is alert.  Psychiatric:        Mood and Affect: Mood and affect normal.     Results:  Labs: Results for orders placed or performed during the hospital encounter of  08/17/23 (from the past 24 hours)  POC Covid19/Flu A&B Antigen     Status: Abnormal   Collection Time: 08/17/23  4:54 PM  Result Value Ref Range   Influenza A Antigen, POC Negative Negative   Influenza B Antigen, POC Negative Negative   Covid Antigen, POC Positive (A) Negative    Radiology: No results found.   UC Course/Treatments:  Procedures: Procedures   Medications Ordered in UC: Medications - No data to display   Assessment and Plan :     ICD-10-CM   1. COVID-19  U07.1     2. Non-recurrent acute suppurative otitis media of both ears without spontaneous rupture of tympanic membranes  H66.003      COVID-19 Afebrile, nontoxic-appearing, NAD. VSS. DDX includes but not limited to: COVID, flu, bronchitis, pneumonia, viral URI COVID was positive today in office. Paxlovid  as directed was prescribed. CMP from 05/2023 showed a creatinine of 0.5. Benzonatate  200mg  TID PRN and Promethazine -DM QID PRN were prescribed for cough. Strict ED precautions were given and patient verbalized understanding.  Non-recurrent acute suppurative otitis media of both ears without spontaneous rupture of tympanic membranes Afebrile, nontoxic-appearing, NAD. VSS. DDX includes but not limited to: otitis media, otitis externa, eustachian tube dysfunction, cerumen impaction  Both TMs were bulging with discolored fluid and slight erythema.  Augmentin  875 mg twice daily was prescribed for bilateral otitis media. Strict ED precautions were given and patient verbalized understanding.   ED Discharge Orders          Ordered    amoxicillin -clavulanate (AUGMENTIN ) 875-125 MG tablet  Every 12 hours        08/17/23 1659    benzonatate  (TESSALON ) 200 MG capsule  3 times daily PRN        08/17/23 1659    promethazine -dextromethorphan (PROMETHAZINE -DM) 6.25-15 MG/5ML syrup  4 times daily PRN        08/17/23 1659    nirmatrelvir/ritonavir (PAXLOVID , 300/100,) 20 x 150 MG & 10 x 100MG  TBPK  2 times daily         08/17/23 1659             PDMP not reviewed this encounter.     Nickolaos Brallier, Bolling Bushy, PA-C 08/17/23 1721

## 2023-08-19 ENCOUNTER — Telehealth: Payer: Self-pay

## 2023-08-19 DIAGNOSIS — B379 Candidiasis, unspecified: Secondary | ICD-10-CM

## 2023-08-19 MED ORDER — CLOTRIMAZOLE 1 % VA CREA: 1.0000 | TOPICAL_CREAM | Freq: Every day | VAGINAL | 0 refills | Status: AC

## 2023-08-19 NOTE — Telephone Encounter (Signed)
Rx sent to pharmacy   

## 2024-02-27 ENCOUNTER — Ambulatory Visit: Admitting: Obstetrics & Gynecology

## 2024-02-27 VITALS — BP 119/75 | HR 74 | Wt 193.5 lb

## 2024-02-27 DIAGNOSIS — Z3046 Encounter for surveillance of implantable subdermal contraceptive: Secondary | ICD-10-CM | POA: Diagnosis not present

## 2024-02-27 DIAGNOSIS — N921 Excessive and frequent menstruation with irregular cycle: Secondary | ICD-10-CM | POA: Diagnosis not present

## 2024-02-27 MED ORDER — MEDROXYPROGESTERONE ACETATE 10 MG PO TABS: 20.0000 mg | ORAL_TABLET | Freq: Every day | ORAL | 0 refills | Status: AC

## 2024-02-27 NOTE — Progress Notes (Signed)
 Patient ID: Renee Barnett, female   DOB: 1995-10-19, 28 y.o.   MRN: [id number]  Chief Complaint  Patient presents with   Menstrual Problem  BTB with Nexplanon  in place 8-9 months  HPI Renee Barnett is a 28 y.o. female.  Bleeding daily for a month with Nexplanon . She had nexplanon  in place for 3 years before her last baby was born and she had no bleeding problems HPI  Past Medical History:  Diagnosis Date   GBS (group B Streptococcus carrier), +RV culture, currently pregnant 10/27/2016   GERD (gastroesophageal reflux disease)    Herpes 12/30/2017   Diagnosed 2018   Migraines    Preterm premature rupture of membranes (PPROM) with unknown onset of labor 10/29/2016    Past Surgical History:  Procedure Laterality Date   WISDOM TOOTH EXTRACTION      Family History  Problem Relation Age of Onset   Hypertension Mother    Migraines Mother    Yvone' disease Maternal Aunt    Diabetes Maternal Grandmother    Asthma Neg Hx    Heart disease Neg Hx    Stroke Neg Hx     Social History Social History   Tobacco Use   Smoking status: Never   Smokeless tobacco: Never  Vaping Use   Vaping status: Never Used  Substance Use Topics   Alcohol use: Not Currently   Drug use: Never    No Known Allergies  Current Outpatient Medications  Medication Sig Dispense Refill   medroxyPROGESTERone  (PROVERA ) 10 MG tablet Take 2 tablets (20 mg total) by mouth daily. 60 tablet 0   benzonatate  (TESSALON ) 200 MG capsule Take 1 capsule (200 mg total) by mouth 3 (three) times daily as needed for cough. (Patient not taking: Reported on 02/27/2024) 15 capsule 0   promethazine -dextromethorphan (PROMETHAZINE -DM) 6.25-15 MG/5ML syrup Take 5 mLs by mouth 4 (four) times daily as needed for cough. (Patient not taking: Reported on 02/27/2024) 180 mL 0   No current facility-administered medications for this visit.    Review of Systems Review of Systems  Constitutional:  Positive for fatigue.   Genitourinary:  Positive for vaginal bleeding. Negative for dysuria, pelvic pain and vaginal discharge.    Blood pressure 119/75, pulse 74, weight 193 lb 8 oz (87.8 kg), last menstrual period 02/06/2024.  Physical Exam Physical Exam Vitals and nursing note reviewed.  Constitutional:      Appearance: Normal appearance.  HENT:     Head: Normocephalic and atraumatic.  Cardiovascular:     Rate and Rhythm: Normal rate.  Pulmonary:     Effort: Pulmonary effort is normal.  Skin:    General: Skin is warm and dry.     Coloration: Skin is not pale.  Neurological:     General: No focal deficit present.     Mental Status: She is alert.     Data Reviewed CBC    Component Value Date/Time   WBC 7.7 10/13/2022 1028   WBC 8.8 06/21/2022 0101   RBC 3.93 10/13/2022 1028   RBC 4.49 06/21/2022 0101   HGB 11.6 10/13/2022 1028   HCT 34.7 10/13/2022 1028   PLT 151 10/13/2022 1028   MCV 88 10/13/2022 1028   MCH 29.5 10/13/2022 1028   MCH 28.5 06/21/2022 0101   MCHC 33.4 10/13/2022 1028   MCHC 34.5 06/21/2022 0101   RDW 13.0 10/13/2022 1028   LYMPHSABS 1.8 07/06/2022 1629   MONOABS 0.5 06/21/2022 0101   EOSABS 0.1 07/06/2022 1629   BASOSABS 0.0 07/06/2022 1629  Assessment Breakthrough bleeding on Nexplanon  - Plan: CBC, medroxyPROGESTERone  (PROVERA ) 10 MG tablet For one month for BTB  Plan Report if sx don't improve Orders Placed This Encounter  Procedures   CBC       Lynwood Solomons 02/27/2024, 4:48 PM

## 2024-02-27 NOTE — Progress Notes (Signed)
 Pt presents for irregular periods. Pt states that she has been on for a month.

## 2024-02-28 ENCOUNTER — Ambulatory Visit: Payer: Self-pay | Admitting: Obstetrics & Gynecology

## 2024-02-28 LAB — CBC
Hematocrit: 39.4 % (ref 34.0–46.6)
Hemoglobin: 12.4 g/dL (ref 11.1–15.9)
MCH: 27.7 pg (ref 26.6–33.0)
MCHC: 31.5 g/dL (ref 31.5–35.7)
MCV: 88 fL (ref 79–97)
Platelets: 223 x10E3/uL (ref 150–450)
RBC: 4.47 x10E6/uL (ref 3.77–5.28)
RDW: 13 % (ref 11.7–15.4)
WBC: 5.2 x10E3/uL (ref 3.4–10.8)

## 2024-06-14 ENCOUNTER — Ambulatory Visit
Admission: RE | Admit: 2024-06-14 | Discharge: 2024-06-14 | Disposition: A | Discharge status: Home / Self Care | Source: Ambulatory Visit | Attending: Family Medicine | Admitting: Family Medicine

## 2024-06-14 VITALS — BP 132/78 | HR 88 | Temp 99.2°F | Resp 16

## 2024-06-14 DIAGNOSIS — J988 Other specified respiratory disorders: Secondary | ICD-10-CM | POA: Diagnosis not present

## 2024-06-14 DIAGNOSIS — R519 Headache, unspecified: Secondary | ICD-10-CM | POA: Diagnosis not present

## 2024-06-14 DIAGNOSIS — B9789 Other viral agents as the cause of diseases classified elsewhere: Secondary | ICD-10-CM

## 2024-06-14 MED ORDER — PSEUDOEPHEDRINE HCL 60 MG PO TABS: 60.0000 mg | ORAL_TABLET | Freq: Three times a day (TID) | ORAL | 0 refills | Status: AC | PRN

## 2024-06-14 MED ORDER — CETIRIZINE HCL 10 MG PO TABS: 10.0000 mg | ORAL_TABLET | Freq: Every day | ORAL | 0 refills | Status: AC

## 2024-06-14 MED ORDER — IBUPROFEN 600 MG PO TABS: 600.0000 mg | ORAL_TABLET | Freq: Four times a day (QID) | ORAL | 0 refills | Status: AC | PRN

## 2024-06-14 NOTE — ED Triage Notes (Signed)
 Pt reports headache x 2 days. Ibuprofen  gives no relief.

## 2024-06-14 NOTE — ED Provider Notes (Signed)
 [facility]  Note:  This document was prepared using Conservation officer, historic buildings and may include unintentional dictation errors.  MRN: [id number] DOB: 04-Nov-1995  Subjective:   Renee Barnett is a 28 y.o. female presenting for 2-day history of runny and stuffy nose, sneezing, frontal headaches.  No confusion, fever, neck pain, sore throat, cough, chest pain, shortness of breath or wheezing.  Presents with her daughter who has similar symptoms.  Has a history of migraines but her migraines are very different from her current symptoms.    No current facility-administered medications for this encounter.  Current Outpatient Medications:    etonogestrel  (NEXPLANON ) 68 MG IMPL implant, 1 each by Subdermal route once., Disp: , Rfl:    ibuprofen  (ADVIL ) 200 MG tablet, Take 200 mg by mouth every 6 (six) hours as needed., Disp: , Rfl:    benzonatate  (TESSALON ) 200 MG capsule, Take 1 capsule (200 mg total) by mouth 3 (three) times daily as needed for cough. (Patient not taking: No sig reported), Disp: 15 capsule, Rfl: 0   medroxyPROGESTERone  (PROVERA ) 10 MG tablet, Take 2 tablets (20 mg total) by mouth daily., Disp: 60 tablet, Rfl: 0   promethazine -dextromethorphan (PROMETHAZINE -DM) 6.25-15 MG/5ML syrup, Take 5 mLs by mouth 4 (four) times daily as needed for cough. (Patient not taking: No sig reported), Disp: 180 mL, Rfl: 0   No Known Allergies  Past Medical History:  Diagnosis Date   GBS (group B Streptococcus carrier), +RV culture, currently pregnant 10/27/2016   GERD (gastroesophageal reflux disease)    Herpes 12/30/2017   Diagnosed 2018   Migraines    Preterm premature rupture of membranes (PPROM) with unknown onset of labor 10/29/2016     Past Surgical History:  Procedure Laterality Date   WISDOM TOOTH EXTRACTION      Family History  Problem Relation Age of Onset   Hypertension Mother    Migraines Mother    Yvone' disease Maternal Aunt    Diabetes  Maternal Grandmother    Asthma Neg Hx    Heart disease Neg Hx    Stroke Neg Hx     Social History   Tobacco Use   Smoking status: Never   Smokeless tobacco: Never  Vaping Use   Vaping status: Never Used  Substance Use Topics   Alcohol use: Not Currently   Drug use: Never    ROS   Objective:   Vitals: BP 132/78 (BP Location: Left Arm)   Pulse 88   Temp 99.2 F (37.3 C) (Oral)   Resp 16   SpO2 98%   Physical Exam Constitutional:      General: She is not in acute distress.    Appearance: Normal appearance. She is well-developed and normal weight. She is not ill-appearing, toxic-appearing or diaphoretic.  HENT:     Head: Normocephalic and atraumatic.     Right Ear: Tympanic membrane, ear canal and external ear normal. No drainage or tenderness. No middle ear effusion. There is no impacted cerumen. Tympanic membrane is not erythematous or bulging.     Left Ear: Tympanic membrane, ear canal and external ear normal. No drainage or tenderness.  No middle ear effusion. There is no impacted cerumen. Tympanic membrane is not erythematous or bulging.     Nose: Congestion present. No rhinorrhea.     Mouth/Throat:     Mouth: Mucous membranes are moist. No oral lesions.     Pharynx: No pharyngeal swelling, oropharyngeal exudate, posterior oropharyngeal erythema or uvula swelling.  Tonsils: No tonsillar exudate or tonsillar abscesses.     Comments: Thick streaks of postnasal drainage overlying pharynx.  Eyes:     General: No scleral icterus.       Right eye: No discharge.        Left eye: No discharge.     Extraocular Movements: Extraocular movements intact.     Right eye: Normal extraocular motion.     Left eye: Normal extraocular motion.     Conjunctiva/sclera: Conjunctivae normal.  Cardiovascular:     Rate and Rhythm: Normal rate.  Pulmonary:     Effort: Pulmonary effort is normal.  Musculoskeletal:     Cervical back: Normal range of motion and neck supple.   Lymphadenopathy:     Cervical: No cervical adenopathy.  Skin:    General: Skin is warm and dry.  Neurological:     General: No focal deficit present.     Mental Status: She is alert and oriented to person, place, and time.     Cranial Nerves: No cranial nerve deficit.     Motor: No weakness.     Coordination: Coordination normal.     Gait: Gait normal.  Psychiatric:        Mood and Affect: Mood normal.        Behavior: Behavior normal.        Thought Content: Thought content normal.        Judgment: Judgment normal.     Assessment and Plan :   PDMP not reviewed this encounter.  1. Viral respiratory infection   2. Sinus headache    No signs of an acute encephalopathy.  This is not a migraine headache.  Suspect viral URI, viral syndrome. Physical exam findings reassuring and vital signs stable for discharge. Advised supportive care, offered symptomatic relief. Counseled patient on potential for adverse effects with medications prescribed/recommended today, ER and return-to-clinic precautions discussed, patient verbalized understanding.     Christopher Savannah, PA-C 06/14/24 1233

## 2024-06-14 NOTE — Discharge Instructions (Signed)
 We will manage this as a viral illness. For sore throat or cough try using a honey-based tea. Use 3 teaspoons of honey with juice squeezed from half lemon. Place shaved pieces of ginger into 1/2-1 cup of water and warm over stove top. Then mix the ingredients and repeat every 4 hours as needed. Please take ibuprofen 600mg  every 6 hours with food alternating with OR taken together with Tylenol 500mg -650mg  every 6 hours for throat pain, fevers, aches and pains. Hydrate very well with at least 2 liters of water. Eat light meals such as soups (chicken and noodles, vegetable, chicken and wild rice).  Do not eat foods that you are allergic to.  Taking an antihistamine like Zyrtec (10mg  daily) can help against postnasal drainage, sinus congestion which can cause sinus pain, sinus headaches, throat pain, painful swallowing, coughing.  You can take this together with pseudoephedrine (Sudafed) at a dose of 60 mg 3 times a day or twice daily as needed for the same kind of nasal drip, congestion.

## 2024-06-28 ENCOUNTER — Telehealth: Admitting: Family Medicine

## 2024-06-28 DIAGNOSIS — J029 Acute pharyngitis, unspecified: Secondary | ICD-10-CM

## 2024-06-28 DIAGNOSIS — J069 Acute upper respiratory infection, unspecified: Secondary | ICD-10-CM | POA: Diagnosis not present

## 2024-06-28 MED ORDER — FLUTICASONE PROPIONATE 50 MCG/ACT NA SUSP: 2.0000 | Freq: Every day | NASAL | 0 refills | Status: AC

## 2024-06-28 MED ORDER — BENZONATATE 200 MG PO CAPS
200.0000 mg | ORAL_CAPSULE | Freq: Three times a day (TID) | ORAL | 0 refills | Status: AC | PRN
Start: 2024-06-28 — End: 2024-07-05

## 2024-06-28 MED ORDER — LIDOCAINE VISCOUS HCL 2 % MT SOLN: 10.0000 mL | OROMUCOSAL | 0 refills | Status: AC | PRN

## 2024-06-28 NOTE — Patient Instructions (Signed)
 Renee Barnett, thank you for joining Roosvelt Mater, PA-C for today's virtual visit.  While this provider is not your primary care provider (PCP), if your PCP is located in our provider database this encounter information will be shared with them immediately following your visit.   A [facility] MyChart account gives you access to today's visit and all your visits, tests, and labs performed at [facility]  click here if you don't have a [facility] MyChart account or go to [email or web address]  Consent: (Patient) Renee Barnett provided verbal consent for this virtual visit at the beginning of the encounter.  Current Medications:  Current Outpatient Medications:    fluticasone  (FLONASE ) 50 MCG/ACT nasal spray, Place 2 sprays into both nostrils daily., Disp: 16 g, Rfl: 0   lidocaine  (XYLOCAINE ) 2 % solution, Use as directed 10 mLs in the mouth or throat as needed for up to 7 days for mouth pain. Swallow 5-10 mL every 4-6 hours as needed for sore throat. DO NOT eat or drink anything for 15-20 minutes after swallowing to allow the medication to coat the throat.., Disp: 200 mL, Rfl: 0   benzonatate  (TESSALON ) 200 MG capsule, Take 1 capsule (200 mg total) by mouth 3 (three) times daily as needed for up to 7 days for cough., Disp: 21 capsule, Rfl: 0   cetirizine  (ZYRTEC  ALLERGY) 10 MG tablet, Take 1 tablet (10 mg total) by mouth daily., Disp: 30 tablet, Rfl: 0   etonogestrel  (NEXPLANON ) 68 MG IMPL implant, 1 each by Subdermal route once., Disp: , Rfl:    ibuprofen  (ADVIL ) 600 MG tablet, Take 1 tablet (600 mg total) by mouth every 6 (six) hours as needed., Disp: 30 tablet, Rfl: 0   medroxyPROGESTERone  (PROVERA ) 10 MG tablet, Take 2 tablets (20 mg total) by mouth daily., Disp: 60 tablet, Rfl: 0   promethazine -dextromethorphan (PROMETHAZINE -DM) 6.25-15 MG/5ML syrup, Take 5 mLs by mouth 4 (four) times daily as needed for cough. (Patient not taking: No sig reported), Disp: 180 mL, Rfl: 0    pseudoephedrine  (SUDAFED) 60 MG tablet, Take 1 tablet (60 mg total) by mouth every 8 (eight) hours as needed for congestion., Disp: 30 tablet, Rfl: 0   Medications ordered in this encounter:  Meds ordered this encounter  Medications   lidocaine  (XYLOCAINE ) 2 % solution    Sig: Use as directed 10 mLs in the mouth or throat as needed for up to 7 days for mouth pain. Swallow 5-10 mL every 4-6 hours as needed for sore throat. DO NOT eat or drink anything for 15-20 minutes after swallowing to allow the medication to coat the throat..    Dispense:  200 mL    Refill:  0   fluticasone  (FLONASE ) 50 MCG/ACT nasal spray    Sig: Place 2 sprays into both nostrils daily.    Dispense:  16 g    Refill:  0   benzonatate  (TESSALON ) 200 MG capsule    Sig: Take 1 capsule (200 mg total) by mouth 3 (three) times daily as needed for up to 7 days for cough.    Dispense:  21 capsule    Refill:  0     *If you need refills on other medications prior to your next appointment, please contact your pharmacy*  Follow-Up: Call back or seek an in-person evaluation if the symptoms worsen or if the condition fails to improve as anticipated.  [facility] [phone]  Other Instructions Upper Respiratory Infection, Adult An upper respiratory infection (URI) is  a common viral infection of the nose, throat, and upper air passages that lead to the lungs. The most common type of URI is the common cold. URIs usually get better on their own, without medical treatment. What are the causes? A URI is caused by a virus. You may catch a virus by: Breathing in droplets from an infected person's cough or sneeze. Touching something that has been exposed to the virus (is contaminated) and then touching your mouth, nose, or eyes. What increases the risk? You are more likely to get a URI if: You are very young or very old. You have close contact with others, such as at work, school, or a health care facility. You  smoke. You have long-term (chronic) heart or lung disease. You have a weakened disease-fighting system (immune system). You have nasal allergies or asthma. You are experiencing a lot of stress. You have poor nutrition. What are the signs or symptoms? A URI usually involves some of the following symptoms: Runny or stuffy (congested) nose. Cough. Sneezing. Sore throat. Headache. Fatigue. Fever. Loss of appetite. Pain in your forehead, behind your eyes, and over your cheekbones (sinus pain). Muscle aches. Redness or irritation of the eyes. Pressure in the ears or face. How is this diagnosed? This condition may be diagnosed based on your medical history and symptoms, and a physical exam. Your health care provider may use a swab to take a mucus sample from your nose (nasal swab). This sample can be tested to determine what virus is causing the illness. How is this treated? URIs usually get better on their own within 7-10 days. Medicines cannot cure URIs, but your health care provider may recommend certain medicines to help relieve symptoms, such as: Over-the-counter cold medicines. Cough suppressants. Coughing is a type of defense against infection that helps to clear the respiratory system, so take these medicines only as recommended by your health care provider. Fever-reducing medicines. Follow these instructions at home: Activity Rest as needed. If you have a fever, stay home from work or school until your fever is gone or until your health care provider says your URI cannot spread to other people (is no longer contagious). Your health care provider may have you wear a face mask to prevent your infection from spreading. Relieving symptoms Gargle with a mixture of salt and water 3-4 times a day or as needed. To make salt water, completely dissolve -1 tsp (3-6 g) of salt in 1 cup (237 mL) of warm water. Use a cool-mist humidifier to add moisture to the air. This can help you breathe  more easily. Eating and drinking  Drink enough fluid to keep your urine pale yellow. Eat soups and other clear broths. General instructions  Take over-the-counter and prescription medicines only as told by your health care provider. These include cold medicines, fever reducers, and cough suppressants. Do not use any products that contain nicotine or tobacco. These products include cigarettes, chewing tobacco, and vaping devices, such as e-cigarettes. If you need help quitting, ask your health care provider. Stay away from secondhand smoke. Stay up to date on all immunizations, including the yearly (annual) flu vaccine. Keep all follow-up visits. This is important. How to prevent the spread of infection to others URIs can be contagious. To prevent the infection from spreading: Wash your hands with soap and water for at least 20 seconds. If soap and water are not available, use hand sanitizer. Avoid touching your mouth, face, eyes, or nose. Cough or sneeze into a  tissue or your sleeve or elbow instead of into your hand or into the air.  Contact a health care provider if: You are getting worse instead of better. You have a fever or chills. Your mucus is brown or red. You have yellow or brown discharge coming from your nose. You have pain in your face, especially when you bend forward. You have swollen neck glands. You have pain while swallowing. You have white areas in the back of your throat. Get help right away if: You have shortness of breath that gets worse. You have severe or persistent: Headache. Ear pain. Sinus pain. Chest pain. You have chronic lung disease along with any of the following: Making high-pitched whistling sounds when you breathe, most often when you breathe out (wheezing). Prolonged cough (more than 14 days). Coughing up blood. A change in your usual mucus. You have a stiff neck. You have changes in your: Vision. Hearing. Thinking. Mood. These symptoms  may be an emergency. Get help right away. Call 911. Do not wait to see if the symptoms will go away. Do not drive yourself to the hospital. Summary An upper respiratory infection (URI) is a common infection of the nose, throat, and upper air passages that lead to the lungs. A URI is caused by a virus. URIs usually get better on their own within 7-10 days. Medicines cannot cure URIs, but your health care provider may recommend certain medicines to help relieve symptoms. This information is not intended to replace advice given to you by your health care provider. Make sure you discuss any questions you have with your health care provider. Document Revised: 02/18/2021 Document Reviewed: 02/18/2021 Elsevier Patient Education  2024 Elsevier Inc.   If you have been instructed to have an in-person evaluation today at a local Urgent Care facility, please use the link below. It will take you to a list of all of our available [facility], including address, phone number and hours of operation. Please do not delay care.  [facility]  If you or a family member do not have a primary care provider, use the link below to schedule a visit and establish care. When you choose a [facility] primary care physician or advanced practice provider, you gain a long-term partner in health. Find a Primary Care Provider  Learn more about [facility]'s in-office and virtual care options:  - Get Care Now

## 2024-06-28 NOTE — Progress Notes (Signed)
 Virtual Visit Consent   Renee Barnett, you are scheduled for a virtual visit with a [facility] provider today. Just as with appointments in the office, your consent must be obtained to participate. Your consent will be active for this visit and any virtual visit you may have with one of our providers in the next 365 days. If you have a MyChart account, a copy of this consent can be sent to you electronically.  As this is a virtual visit, video technology does not allow for your provider to perform a traditional examination. This may limit your provider's ability to fully assess your condition. If your provider identifies any concerns that need to be evaluated in person or the need to arrange testing (such as labs, EKG, etc.), we will make arrangements to do so. Although advances in technology are sophisticated, we cannot ensure that it will always work on either your end or our end. If the connection with a video visit is poor, the visit may have to be switched to a telephone visit. With either a video or telephone visit, we are not always able to ensure that we have a secure connection.  By engaging in this virtual visit, you consent to the provision of healthcare and authorize for your insurance to be billed (if applicable) for the services provided during this visit. Depending on your insurance coverage, you may receive a charge related to this service.  I need to obtain your verbal consent now. Are you willing to proceed with your visit today? Dawne Casali has provided verbal consent on 06/28/2024 for a virtual visit (video or telephone). Renee Barnett, [place]  Date: 06/28/2024 8:07 AM   Virtual Visit via Video Note   I, Renee Barnett, connected with  Starlene Consuegra  ([id number], 1996-01-14) on 06/28/24 at  7:45 AM EST by a video-enabled telemedicine application and verified that I am speaking with the correct person using two identifiers.  Location: Patient: Virtual Visit Location Patient:  Home Provider: Virtual Visit Location Provider: Home Office   I discussed the limitations of evaluation and management by telemedicine and the availability of in person appointments. The patient expressed understanding and agreed to proceed.    History of Present Illness: Renee Barnett is a 28 y.o. who identifies as a female who was assigned female at birth, and is being seen today for c/o a bad cough that is starting to make her throat sore.  Pt states symptoms started on Tuesday.  Pt states taking Dayquil and Ibuprofen .  Pt reports some congestion. Pt denies fever chills or body aches.  Pt states her son has a little cough.   HPI: HPI  Problems:  Patient Active Problem List   Diagnosis Date Noted   Dysuria 11/29/2022   Overweight BMI=28.2 02/12/2021    Allergies: No Known Allergies Medications:  Current Outpatient Medications:    fluticasone  (FLONASE ) 50 MCG/ACT nasal spray, Place 2 sprays into both nostrils daily., Disp: 16 g, Rfl: 0   lidocaine  (XYLOCAINE ) 2 % solution, Use as directed 10 mLs in the mouth or throat as needed for up to 7 days for mouth pain. Swallow 5-10 mL every 4-6 hours as needed for sore throat. DO NOT eat or drink anything for 15-20 minutes after swallowing to allow the medication to coat the throat.., Disp: 200 mL, Rfl: 0   benzonatate  (TESSALON ) 200 MG capsule, Take 1 capsule (200 mg total) by mouth 3 (three) times daily as needed for up to 7 days for cough., Disp: 21  capsule, Rfl: 0   cetirizine  (ZYRTEC  ALLERGY) 10 MG tablet, Take 1 tablet (10 mg total) by mouth daily., Disp: 30 tablet, Rfl: 0   etonogestrel  (NEXPLANON ) 68 MG IMPL implant, 1 each by Subdermal route once., Disp: , Rfl:    ibuprofen  (ADVIL ) 600 MG tablet, Take 1 tablet (600 mg total) by mouth every 6 (six) hours as needed., Disp: 30 tablet, Rfl: 0   medroxyPROGESTERone  (PROVERA ) 10 MG tablet, Take 2 tablets (20 mg total) by mouth daily., Disp: 60 tablet, Rfl: 0   promethazine -dextromethorphan  (PROMETHAZINE -DM) 6.25-15 MG/5ML syrup, Take 5 mLs by mouth 4 (four) times daily as needed for cough. (Patient not taking: No sig reported), Disp: 180 mL, Rfl: 0   pseudoephedrine  (SUDAFED) 60 MG tablet, Take 1 tablet (60 mg total) by mouth every 8 (eight) hours as needed for congestion., Disp: 30 tablet, Rfl: 0  Observations/Objective: Patient is well-developed, well-nourished in no acute distress.  Resting comfortably at home.  Head is normocephalic, atraumatic.  No labored breathing.  Speech is clear and coherent with logical content.  Patient is alert and oriented at baseline.    Assessment and Plan: 1. Upper respiratory tract infection, unspecified type (Primary) - fluticasone  (FLONASE ) 50 MCG/ACT nasal spray; Place 2 sprays into both nostrils daily.  Dispense: 16 g; Refill: 0 - benzonatate  (TESSALON ) 200 MG capsule; Take 1 capsule (200 mg total) by mouth 3 (three) times daily as needed for up to 7 days for cough.  Dispense: 21 capsule; Refill: 0  2. Sore throat - lidocaine  (XYLOCAINE ) 2 % solution; Use as directed 10 mLs in the mouth or throat as needed for up to 7 days for mouth pain. Swallow 5-10 mL every 4-6 hours as needed for sore throat. DO NOT eat or drink anything for 15-20 minutes after swallowing to allow the medication to coat the throat..  Dispense: 200 mL; Refill: 0  -Continue with conservative treatments -Pt to follow up with PCP or urgent care for worsening symptoms  Follow Up Instructions: I discussed the assessment and treatment plan with the patient. The patient was provided an opportunity to ask questions and all were answered. The patient agreed with the plan and demonstrated an understanding of the instructions.  A copy of instructions were sent to the patient via MyChart unless otherwise noted below.    The patient was advised to call back or seek an in-person evaluation if the symptoms worsen or if the condition fails to improve as anticipated.    Renee Mater, PA-C
